# Patient Record
Sex: Male | Born: 1955 | Race: Black or African American | Hispanic: No | Marital: Married | Smoking: Current some day smoker
Health system: Southern US, Community
[De-identification: ages and names within clinical notes are randomized; demographics above are authoritative.]

## PROBLEM LIST (undated history)

## (undated) ENCOUNTER — Emergency Department (HOSPITAL_COMMUNITY): Disposition: A | Discharge status: Home / Self Care | Payer: Self-pay

## (undated) DIAGNOSIS — I639 Cerebral infarction, unspecified: Secondary | ICD-10-CM

## (undated) DIAGNOSIS — E785 Hyperlipidemia, unspecified: Secondary | ICD-10-CM

## (undated) HISTORY — DX: Cerebral infarction, unspecified: I63.9

## (undated) HISTORY — PX: NO PAST SURGERIES: SHX2092

## (undated) HISTORY — DX: Hyperlipidemia, unspecified: E78.5

---

## 2007-01-02 ENCOUNTER — Emergency Department (HOSPITAL_COMMUNITY): Admission: EM | Admit: 2007-01-02 | Discharge: 2007-01-02 | Payer: Self-pay | Admitting: Emergency Medicine

## 2007-01-24 ENCOUNTER — Ambulatory Visit: Payer: Self-pay | Admitting: Internal Medicine

## 2007-02-14 ENCOUNTER — Ambulatory Visit: Payer: Self-pay | Admitting: Internal Medicine

## 2007-02-26 ENCOUNTER — Ambulatory Visit (HOSPITAL_COMMUNITY): Admission: RE | Admit: 2007-02-26 | Discharge: 2007-02-26 | Payer: Self-pay | Admitting: Internal Medicine

## 2007-03-10 ENCOUNTER — Ambulatory Visit: Payer: Self-pay | Admitting: Internal Medicine

## 2007-09-26 ENCOUNTER — Ambulatory Visit: Payer: Self-pay | Admitting: Internal Medicine

## 2007-09-26 LAB — CONVERTED CEMR LAB
Eosinophils Relative: 1 % (ref 0–5)
HCT: 35.9 % — ABNORMAL LOW (ref 39.0–52.0)
Hemoglobin: 11.8 g/dL — ABNORMAL LOW (ref 13.0–17.0)
Lymphocytes Relative: 34 % (ref 12–46)
Lymphs Abs: 2.9 10*3/uL (ref 0.7–4.0)
Monocytes Absolute: 0.7 10*3/uL (ref 0.1–1.0)
Neutro Abs: 5.1 10*3/uL (ref 1.7–7.7)
Platelets: 227 10*3/uL (ref 150–400)
Retic Ct Pct: 2.4 % (ref 0.4–3.1)
WBC: 8.8 10*3/uL (ref 4.0–10.5)

## 2007-10-02 ENCOUNTER — Ambulatory Visit: Payer: Self-pay | Admitting: Vascular Surgery

## 2007-10-02 ENCOUNTER — Ambulatory Visit (HOSPITAL_COMMUNITY): Admission: RE | Admit: 2007-10-02 | Discharge: 2007-10-02 | Payer: Self-pay | Admitting: Internal Medicine

## 2007-11-03 ENCOUNTER — Ambulatory Visit: Payer: Self-pay | Admitting: *Deleted

## 2007-11-03 ENCOUNTER — Ambulatory Visit: Payer: Self-pay | Admitting: Internal Medicine

## 2007-11-03 LAB — CONVERTED CEMR LAB
Ferritin: 26 ng/mL (ref 22–322)
HDL: 42 mg/dL (ref >39)
Saturation Ratios: 17 % — ABNORMAL LOW (ref 20–55)
TIBC: 407 ug/dL (ref 215–435)
UIBC: 336 ug/dL
Uric Acid, Serum: 5.1 mg/dL (ref 4.0–7.8)

## 2007-12-01 ENCOUNTER — Ambulatory Visit: Payer: Self-pay | Admitting: Family Medicine

## 2008-01-08 ENCOUNTER — Ambulatory Visit: Payer: Self-pay | Admitting: Family Medicine

## 2008-01-08 ENCOUNTER — Emergency Department (HOSPITAL_COMMUNITY): Admission: EM | Admit: 2008-01-08 | Discharge: 2008-01-08 | Payer: Self-pay | Admitting: Emergency Medicine

## 2008-01-09 ENCOUNTER — Encounter (INDEPENDENT_AMBULATORY_CARE_PROVIDER_SITE_OTHER): Payer: Self-pay | Admitting: Internal Medicine

## 2008-03-15 ENCOUNTER — Ambulatory Visit: Payer: Self-pay | Admitting: Family Medicine

## 2008-03-15 LAB — CONVERTED CEMR LAB
ALT: 8 units/L (ref 0–53)
AST: 19 units/L (ref 0–37)
Albumin: 4.4 g/dL (ref 3.5–5.2)
Alkaline Phosphatase: 72 units/L (ref 39–117)
BUN: 11 mg/dL (ref 6–23)
Calcium: 9.5 mg/dL (ref 8.4–10.5)
Chloride: 104 meq/L (ref 96–112)
LDL Cholesterol: 124 mg/dL — ABNORMAL HIGH (ref 0–99)
Potassium: 4.6 meq/L (ref 3.5–5.3)
Sodium: 138 meq/L (ref 135–145)
Total Protein: 8.1 g/dL (ref 6.0–8.3)

## 2008-03-22 ENCOUNTER — Ambulatory Visit: Payer: Self-pay | Admitting: Family Medicine

## 2008-03-22 LAB — CONVERTED CEMR LAB
Eosinophils Absolute: 0.1 10*3/uL (ref 0.0–0.7)
Eosinophils Relative: 1 % (ref 0–5)
Ferritin: 31 ng/mL (ref 22–322)
HCT: 35.3 % — ABNORMAL LOW (ref 39.0–52.0)
Hemoglobin: 11.9 g/dL — ABNORMAL LOW (ref 13.0–17.0)
Hgb F Quant: 0 % (ref 0.0–2.0)
Hgb S Quant: 0 % (ref 0.0–0.0)
Lymphocytes Relative: 39 % (ref 12–46)
Lymphs Abs: 3.2 10*3/uL (ref 0.7–4.0)
MCV: 89.4 fL (ref 78.0–100.0)
Monocytes Relative: 7 % (ref 3–12)
RBC: 3.95 M/uL — ABNORMAL LOW (ref 4.22–5.81)
WBC: 8.3 10*3/uL (ref 4.0–10.5)

## 2008-04-05 ENCOUNTER — Encounter: Admission: RE | Admit: 2008-04-05 | Discharge: 2008-07-04 | Payer: Self-pay | Admitting: Family Medicine

## 2011-06-12 LAB — RAPID STREP SCREEN (MED CTR MEBANE ONLY): Streptococcus, Group A Screen (Direct): NEGATIVE

## 2011-08-01 ENCOUNTER — Encounter: Payer: Self-pay | Admitting: *Deleted

## 2011-08-01 ENCOUNTER — Other Ambulatory Visit: Payer: Self-pay

## 2011-08-01 ENCOUNTER — Emergency Department (HOSPITAL_COMMUNITY)
Admission: EM | Admit: 2011-08-01 | Discharge: 2011-08-01 | Discharge status: Against Medical Advice | Payer: Self-pay | Attending: Emergency Medicine | Admitting: Emergency Medicine

## 2011-08-01 DIAGNOSIS — R079 Chest pain, unspecified: Secondary | ICD-10-CM | POA: Insufficient documentation

## 2011-08-01 NOTE — ED Notes (Signed)
Reports approx 1 hour ago, onset of chest discomfort, feels like "he needs to belch" and feels like he cant swallow. Airway intact, resp e/u.

## 2014-03-29 ENCOUNTER — Emergency Department (HOSPITAL_COMMUNITY): Payer: Self-pay

## 2014-03-29 ENCOUNTER — Encounter (HOSPITAL_COMMUNITY): Payer: Self-pay | Admitting: Emergency Medicine

## 2014-03-29 ENCOUNTER — Emergency Department (HOSPITAL_COMMUNITY)
Admission: EM | Admit: 2014-03-29 | Discharge: 2014-03-29 | Disposition: A | Discharge status: Home / Self Care | Payer: Self-pay | Attending: Emergency Medicine | Admitting: Emergency Medicine

## 2014-03-29 DIAGNOSIS — F17200 Nicotine dependence, unspecified, uncomplicated: Secondary | ICD-10-CM | POA: Insufficient documentation

## 2014-03-29 DIAGNOSIS — B070 Plantar wart: Secondary | ICD-10-CM | POA: Insufficient documentation

## 2014-03-29 NOTE — ED Notes (Signed)
Pt presents with lesion to bottom left foot; painful to ambulate; hard and crusty to touch; no warmth; no drainage; denies known foreign body

## 2014-03-29 NOTE — ED Provider Notes (Signed)
CSN: [id number]     Arrival date & time 03/29/14  4098 History   First MD Initiated Contact with Patient 03/29/14 [phone]     Chief Complaint  Patient presents with  . Foot Pain     (Consider location/radiation/quality/duration/timing/severity/associated sxs/prior Treatment) HPI Comments: Patient presents with a chief complaint of left foot pain for the past 2 months.  He reports that he has a "sore" on the bottom of his foot and that the pain is located to the area.  Pain is worse with palpation and bearing weight.  He reports that the "sore" has been there for the past 2 months.  He denies any injury or trauma.  He has not tried any treatment prior to arrival.  He denies fever, chills, swelling of the foot, numbness, or tingling.    The history is provided by the patient.    History reviewed. No pertinent past medical history. History reviewed. No pertinent past surgical history. History reviewed. No pertinent family history. History  Substance Use Topics  . Smoking status: Current Every Day Smoker -- 1.00 packs/day    Types: Cigarettes  . Smokeless tobacco: Not on file  . Alcohol Use: Yes     Comment: occ    Review of Systems  Constitutional: Negative for fever and chills.  Musculoskeletal:       Left foot pain  Neurological: Negative for numbness.      Allergies  Review of patient's allergies indicates no known allergies.  Home Medications   Prior to Admission medications   Not on File   BP 168/94  Pulse 71  Temp(Src) 97.1 F (36.2 C) (Oral)  Resp 18  SpO2 100% Physical Exam  Nursing note and vitals reviewed. Constitutional: He appears well-developed and well-nourished.  HENT:  Head: Normocephalic and atraumatic.  Mouth/Throat: Oropharynx is clear and moist.  Neck: Normal range of motion. Neck supple.  Cardiovascular: Normal rate, regular rhythm and normal heart sounds.   Pulses:      Dorsalis pedis pulses are 2+ on the right side, and 2+ on the left side.   Pulmonary/Chest: Effort normal and breath sounds normal.  Musculoskeletal: Normal range of motion.  Plantar wart of the sole of the left foot near the calcaneous.  No surrounding erythema, edema, or warmth.  Neurological: He is alert.  Distal sensation of left foot intact  Skin: Skin is warm and dry.    ED Course  Procedures (including critical care time) Labs Review Labs Reviewed - No data to display  Imaging Review Dg Foot Complete Left  03/29/2014   CLINICAL DATA:  No known injury. Sore plantar surface of foot at level of the medial aspect of the mid os calcis.  EXAM: LEFT FOOT - COMPLETE 3+ VIEW  COMPARISON:  02/26/2007 MR.  FINDINGS: Soft tissue abnormality plantar aspect of the left foot at the level of the calcaneus may represent the patient's sore.  No underlying plain film findings of osteomyelitis.  No fracture or dislocation.  IMPRESSION: Soft tissue abnormality plantar aspect of the left foot at the level of the calcaneus may represent the patient's sore.  No underlying plain film findings of osteomyelitis.   Electronically Signed   By: Bridgett Larsson M.D.   On: 03/29/2014 09:46     EKG Interpretation None      MDM   Final diagnoses:  None   Patient presenting with a lesion to the plantar aspect of the foot.  Appearance consistent with a Plantar Wart.  No  signs of infection.  Xray ordered in triage did not show any evidence of Osteomyelitis.  Patient given referral to PCP.  Patient is stable for discharge.  Return precautions given.    Santiago GladHeather Merci Walthers, PA-C 03/29/14 1134

## 2014-03-29 NOTE — Discharge Instructions (Signed)
Plantar Wart Warts are benign (noncancerous) growths of the outer skin layer. They can occur at any time in life but are most common during childhood and the teen years. Warts can occur on many skin surfaces of the body. When they occur on the underside (sole) of your foot they are called plantar warts. They often emerge in groups with several small warts encircling a larger growth. CAUSES  Human papillomavirus (HPV) is the cause of plantar warts. HPV attacks a break in the skin of the foot. Walking barefoot can lead to exposure to the wart virus. Plantar warts tend to develop over areas of pressure such as the heel and ball of the foot. Plantar warts often grow into the deeper layers of skin. They may spread to other areas of the sole but cannot spread to other areas of the body. SYMPTOMS  You may also notice a growth on the undersurface of your foot. The wart may grow directly into the sole of the foot, or rise above the surface of the skin on the sole of the foot, or both. They are most often flat from pressure. Warts generally do not cause itching but may cause pain in the area of the wart when you put weight on your foot. DIAGNOSIS  Diagnosis is made by physical examination. This means your caregiver discovers it while examining your foot.  TREATMENT  There are many ways to treat plantar warts. However, warts are very tough. Sometimes it is difficult to treat them so that they go away completely and do not grow back. Any treatment must be done regularly to work. If left untreated, most plantar warts will eventually disappear over a period of one to two years. Treatments you can do at home include:  Putting duct tape over the top of the wart (occlusion), has been found to be effective over several months. The duct tape should be removed each night and reapplied until the wart has disappeared.  Placing over-the-counter medications on top of the wart to help kill the wart virus and remove the wart  tissue (salicylic acid, cantharidin, and dichloroacetic acid ) are useful. These are called keratolytic agents. These medications make the skin soft and gradually layers will shed away. Theses compounds are usually placed on the wart each night and then covered with a band-aid. They are also available in pre-medicated band-aid form. Avoid surrounding skin when applying these liquids as these medications can burn healthy skin. The treatment may take several months of nightly use to be effective.  Cryotherapy to freeze the wart has recently become available over-the-counter for children 4 years and older. This system makes use of a soft narrow applicator connected to a bottle of compressed cold liquid that is applied directly to the wart. This medication can burn health skin and should be used with caution.  As with all over-the-counter medications, read the directions carefully before use. Treatments generally done in your caregiver's office include:  Some aggressive treatments may cause discomfort, discoloration and scaring of the surrounding skin. The risks and benefits of treatment should be discussed with your caregiver.  Freezing the wart with liquid nitrogen (cryotherapy, see above).  Burning the wart with use of very high heat (cautery).  Injecting medication into the wart.  Surgically removing or laser treatment of the wart.  Your caregiver may refer you to a dermatologist for difficult to treat, large sized or large numbers of warts. HOME CARE INSTRUCTIONS   Soak the affected area in warm water. Dry  the area completely when you are done. Remove the top layer of softened skin, then apply the chosen topical medication and reapply a bandage.  Remove the bandage daily and file excess wart tissue (pumice stone works well for this purpose). Repeat the entire process daily or every other day for weeks until the plantar wart disappears.  Several brands of salicylic acid pads are available as  over-the-counter remedies.  Pain can be relieved by wearing a doughnut bandage. This is a bandage with a hole in it. The bandage is put on with the hole over the wart. This helps take the pressure off the wart and gives pain relief. To help prevent plantar warts:  Wear shoes and socks and change them daily.  Keep feet clean and dry.  Check your feet and your children's feet regularly.  Avoid direct contact with warts on other people.  Have growths, or changes on your skin checked by your caregiver. Document Released: 11/24/2003 Document Revised: 11/26/2011 Document Reviewed: 05/04/2009 [facility] Patient Information 2015 [place] [employer]. This information is not intended to replace advice given to you by your health care provider. Make sure you discuss any questions you have with your health care provider.   Emergency Department Resource Guide 1) Find a Doctor and Pay Out of Pocket Although you won't have to find out who is covered by your insurance plan, it is a good idea to ask around and get recommendations. You will then need to call the office and see if the doctor you have chosen will accept you as a new patient and what types of options they offer for patients who are self-pay. Some doctors offer discounts or will set up payment plans for their patients who do not have insurance, but you will need to ask so you aren't surprised when you get to your appointment.  2) Contact Your Local Health Department Not all health departments have doctors that can see patients for sick visits, but many do, so it is worth a call to see if yours does. If you don't know where your local health department is, you can check in your phone book. The CDC also has a tool to help you locate your state's health department, and many state websites also have listings of all of their local health departments.  3) Find a Walk-in Clinic If your illness is not likely to be very severe or complicated, you may want to  try a walk in clinic. These are popping up all over the country in pharmacies, drugstores, and shopping centers. They're usually staffed by nurse practitioners or physician assistants that have been trained to treat common illnesses and complaints. They're usually fairly quick and inexpensive. However, if you have serious medical issues or chronic medical problems, these are probably not your best option.  No Primary Care Doctor: - Call Health Connect at  [phone] - they can help you locate a primary care doctor that  accepts your insurance, provides certain services, etc. - Physician Referral Service- [phone]  Chronic Pain Problems: Organization         Address  Phone   Notes  [facility]  [phone] Patients need to be referred by their primary care doctor.   Medication Assistance: Organization         Address  Phone   Notes  [facility] Medication [facility] [address] [place], [place] [phone] --Must be a resident of [facility] [place] -- Must have NO  insurance coverage whatsoever (no Medicaid/ Medicare, etc.) -- The pt. MUST have a primary care doctor that directs their care regularly and follows them in the community   MedAssist  [phone]   [employer]  [phone]    Agencies that provide inexpensive medical care: Organization         Address  Phone   Notes  [facility]  [phone]   [facility]    [phone]   [facility] [address] [place], [place] [address] [phone]   [facility] [address] [place] [place] [phone]   Planned Parenthood    [phone]   [facility]    [phone]   [facility]  [address], [place] Phone:  [phone], Fax:  [phone] Hours of Operation:  9 am - 6 pm, M-F.  Also accepts Medicaid/Medicare and self-pay.  [facility]  [address], [place] Phone: [phone], Fax: [phone]. Hours of Operation:  8:30 am - 5:30 pm, M-F.  Also accepts Medicaid and self-pay.  [facility] [place] [address], [place] Phone: [phone]   [facility] [address] [place], [place] [phone], Ext. [phone] Mondays & Thursdays: 7-9 AM.  First 15 patients are seen on a first come, first serve basis.    Medicaid-accepting [facility] Providers:  Organization         Address  Phone   Notes  [facility] [address],  [phone] Also accepts self-pay patients.  [facility] [address] Laurell Josephs [place], [place]  [phone]   [facility] [address], [place] [phone]   [facility] [address], [place] [phone]   Renaye Rakers [address], [place]   [phone] Only accepts [place] Access [place] patients after they have their name applied to their card.   Self-Pay (no insurance) in [facility]:  Organization         Address  Phone   Notes  Sickle Cell Patients, [facility] [address] [place], [place] [phone]   [facility] [address] [place], [place] [phone]   [facility] [place]  [address], [place] [phone]   [facility]/Dr. Osei-Bonsu  [address] [place] or [address], [place] [phone] Phone number for both [place] and [place] locations is the same.  [facility] and [facility] [place] [address], [place] [phone]   [facility] [address] [place] or [address] [phone]   [facility] [address] [place] [phone], phone; [phone], fax  Sees patients 1st and 3rd Saturday of every month.  Must not qualify for public or private insurance (i.e. Medicaid, Medicare,  Health Choice, Veterans' Benefits)  Household income should be no more than 200% of the poverty level The clinic cannot treat you if you are pregnant or think you are pregnant  Sexually transmitted diseases are not treated at the clinic.    Dental Care: Organization         Address  Phone  Notes  [facility]  Department of Public [facility] [address] [place], [place] [phone] Accepts children up to age 40 who are enrolled in [place] or [place] Health Choice; pregnant women with a Medicaid card; and children who have applied for Medicaid or [place] Health Choice, but were declined, whose parents can pay a reduced fee at time of service.  [facility] Department of [facility]  [address], [place] [phone] Accepts children up to age 28 who are enrolled in [place] or [place] Health Choice; pregnant women with a Medicaid card; and children who have applied for Medicaid or  Health Choice, but were declined, whose parents can pay a reduced fee at time of service.  [facility]  [address] [place], [place] [phone] Patients are seen by appointment only. Walk-ins are not accepted. [facility] will see patients 46 years of age and older. Monday - Tuesday (8am-5pm) Most Wednesdays (8:30-5pm) $30 per visit, cash only  [facility] Adult Dental Access PROGRAM  [address], [facility] [phone] Patients are seen by appointment only. Walk-ins are not accepted. [facility] will see patients 61 years of age and older. One Wednesday Evening (Monthly: Volunteer Based).  $30 per visit, cash only  [employer]  [phone] for adults; Children under age 56, call Graduate Pediatric Dentistry at [phone]. Children aged 80-14, please call [phone] to  request a pediatric application.  Dental services are provided in all areas of dental care including fillings, crowns and bridges, complete and partial dentures, implants, gum treatment, root canals, and extractions. Preventive care is also provided. Treatment is provided to both adults and children. Patients are selected via a lottery and there is often a waiting list.   [facility] [address], [place]  [phone] [email or web address]   [facility] [address] [place], [place] [phone], Ext. [phone] Second and Fourth Thursday of each month, opens at 6:30 AM; Clinic ends at 9 AM.  Patients are seen on a first-come first-served basis, and a limited number are seen during each clinic.   [facility]  [address] Ether Griffins [place], [place] [phone]   Eligibility Requirements You must have lived in [place], [place], or [place] counties for at least the last three months.   You cannot be eligible for state or federal sponsored [place], including [employer], [place], or [employer].   You generally cannot be eligible for healthcare insurance through your employer.    How to apply: Eligibility screenings are held every Tuesday and Wednesday afternoon from 1:00 pm until 4:00 pm. You do not need an appointment for the interview!  [facility] [address] [place], [place] [phone]   [facility] Health Department  [phone]   [facility] Health Department  [phone]   [facility] Health Department  [phone]    Behavioral Health Resources in the Community: Intensive Outpatient Programs Organization         Address  Phone  Notes  [facility] [address], [place], [place] [phone]   [facility] Outpatient [address] [place], [place] [phone]   [facility] [address] [place], [place]  [phone]   [facility] Mental Health [address],  [place], [place] [phone] or [phone]   Substance Abuse Resources Organization  Address  Phone  Notes  Al[facility] Drug Services  [phone]   [facility]  [phone]   [facility] [place]  [phone]   Floydene Flock  [phone]   Residential & Outpatient Substance Abuse Program  [phone]   Psychological Services Organization         Address  Phone  Notes  [facility] Behavioral Health  [phone]   [facility] Services  [phone]   [facility] [address], [place] [phone] or [phone]    Mobile Crisis Teams Organization         Address  Phone  Notes  [facility], Mobile Crisis Care Unit  [phone]   [facility]  [address] [place], [place] [phone]   Doristine Locks [address] [place] [phone]    Self-Help/Support Groups Organization         Address  Phone             Notes  [facility] [place] - variety of support groups  336- I7437963 Call for more information  Narcotics Anonymous (NA), [facility] [address], [employer] [place]  2 meetings at this location   Statistician         Address  Phone  Notes  [facility] [address],    [place]  [phone]   [facility]  [address] [place] [address], [place], [place] [phone]   [facility] [address] [place], [place] [phone] Admissions: 8am-3pm M-F  [facility] [address]    [place], [place] [phone]   [facility] [address] [place], [place], [place] [phone]   [facility] [address],  [place], [place] [phone]   [facility] - Intensive Outpatient [address] Laurell Joseph[address], [place], [place] [phone]   [facility] ([facility]iction [facility] [address] [place]  [place], [place] [phone] or [phone])[phone]   [facility] ([facility]) [address] [place], [place] [phone] Accepts Medicaid  [facility] [address],  [place] [phone] Substance Abuse/Addiction Treatment   [facility] Organization         Address  Phone  Notes  [facility]  [phone]   Angie Fava, PhD [address] Ervin Knack [place], [place]   [phone] or [phone]   [facility] Behavioral   [address] [place], [place] [phone]   [facility] [address], [place], [place] [phone] Insurance/Medicaid/sponsorship through [facility] [address]                                    [place], [place] [phone] Therapy/tele-psych/case  [facility] [address] Street[place], [place] [phone]    Dr. Lolly Mustache  [phone]   [facility]  [employer] [facility] Dept. 1) [address], [place] 2) [address] [place] 3)  [address], [place] [phone]  [phone]   [facility] Child Abuse Hotline (720)361-0916 or [phone])547-2159 (After Hours)

## 2014-03-29 NOTE — ED Notes (Signed)
Pt states pain to bottom of left foot x 2 months.  Pt has not been seen for treatment prior.  Pt states knot on bottom of left foot.  Pain is worse with standing.

## 2014-03-30 NOTE — ED Provider Notes (Signed)
Medical screening examination/treatment/procedure(s) were performed by non-physician practitioner and as supervising physician I was immediately available for consultation/collaboration.   EKG Interpretation None        Audree CamelScott T Jaythen Hamme, MD 03/30/14 [phone]

## 2014-10-04 ENCOUNTER — Ambulatory Visit (INDEPENDENT_AMBULATORY_CARE_PROVIDER_SITE_OTHER): Payer: Self-pay | Admitting: Internal Medicine

## 2014-10-04 VITALS — BP 132/88 | HR 78 | Temp 98.0°F | Resp 18 | Ht 72.75 in | Wt 170.8 lb

## 2014-10-04 DIAGNOSIS — N509 Disorder of male genital organs, unspecified: Secondary | ICD-10-CM

## 2014-10-04 DIAGNOSIS — Z202 Contact with and (suspected) exposure to infections with a predominantly sexual mode of transmission: Secondary | ICD-10-CM

## 2014-10-04 DIAGNOSIS — R21 Rash and other nonspecific skin eruption: Secondary | ICD-10-CM

## 2014-10-04 DIAGNOSIS — N5089 Other specified disorders of the male genital organs: Secondary | ICD-10-CM

## 2014-10-04 LAB — POCT URINALYSIS DIPSTICK
Bilirubin, UA: NEGATIVE
Glucose, UA: NEGATIVE
Ketones, UA: NEGATIVE
Leukocytes, UA: NEGATIVE
Nitrite, UA: NEGATIVE
Protein, UA: NEGATIVE
Spec Grav, UA: 1.025
Urobilinogen, UA: 0.2
pH, UA: 6

## 2014-10-04 LAB — POCT CBC
Granulocyte percent: 69.4 %G (ref 37–80)
HCT, POC: 42.7 % — AB (ref 43.5–53.7)
HEMOGLOBIN: 13.7 g/dL — AB (ref 14.1–18.1)
LYMPH, POC: 2.7 (ref 0.6–3.4)
MCH, POC: 30.1 pg (ref 27–31.2)
MCHC: 32.1 g/dL (ref 31.8–35.4)
MCV: 93.6 fL (ref 80–97)
MID (cbc): 0.2 (ref 0–0.9)
MPV: 6.7 fL (ref 0–99.8)
PLATELET COUNT, POC: 289 10*3/uL (ref 142–424)
POC GRANULOCYTE: 6.6 (ref 2–6.9)
POC LYMPH %: 28.2 % (ref 10–50)
POC MID %: 2.4 %M (ref 0–12)
RBC: 4.56 M/uL — AB (ref 4.69–6.13)
RDW, POC: 14.9 %
WBC: 9.5 10*3/uL (ref 4.6–10.2)

## 2014-10-04 LAB — POCT UA - MICROSCOPIC ONLY
BACTERIA, U MICROSCOPIC: NEGATIVE
CASTS, UR, LPF, POC: NEGATIVE
CRYSTALS, UR, HPF, POC: NEGATIVE
EPITHELIAL CELLS, URINE PER MICROSCOPY: NEGATIVE
MUCUS UA: NEGATIVE
Yeast, UA: NEGATIVE

## 2014-10-04 LAB — HIV ANTIBODY (ROUTINE TESTING W REFLEX): HIV: NONREACTIVE

## 2014-10-04 LAB — RPR

## 2014-10-04 MED ORDER — DOXYCYCLINE HYCLATE 100 MG PO TABS: 100.0000 mg | ORAL_TABLET | Freq: Two times a day (BID) | ORAL | Status: DC

## 2014-10-04 MED ORDER — VALACYCLOVIR HCL 1 G PO TABS: 1000.0000 mg | ORAL_TABLET | Freq: Two times a day (BID) | ORAL | Status: DC

## 2014-10-04 MED ORDER — MUPIROCIN 2 % EX OINT: 1.0000 "application " | TOPICAL_OINTMENT | Freq: Three times a day (TID) | CUTANEOUS | Status: DC

## 2014-10-04 NOTE — Progress Notes (Signed)
   Subjective:    Patient ID: Justin Odom, male    DOB: 11/11/1955, 59 y.o.   MRN: [id number]  HPI Had exposure to risky male contact, was told she had trichomonas, he has rash on his penis distal foreskin for 2-3 weeks. Not painful to urinate, no discharge. Has no doctor. No fatigue, anorexia, weight loss. 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   Review of Systems     Objective:   Physical Exam  Constitutional: He is oriented to person, place, and time. He appears well-developed and well-nourished. No distress.  HENT:  Head: Normocephalic.  Nose: Nose normal.  Eyes: Conjunctivae and EOM are normal. Pupils are equal, round, and reactive to light. No scleral icterus.  Neck: Normal range of motion.  Pulmonary/Chest: Effort normal.  Abdominal: Hernia confirmed negative in the right inguinal area and confirmed negative in the left inguinal area.  Genitourinary: Testes normal.    Uncircumcised. Penile tenderness present. No discharge found.  Musculoskeletal: Normal range of motion.  Lymphadenopathy:       Right: No inguinal adenopathy present.       Left: No inguinal adenopathy present.  Neurological: He is alert and oriented to person, place, and time. He exhibits normal muscle tone. Coordination normal.  Skin: Rash noted.  Psychiatric: He has a normal mood and affect. His behavior is normal. Thought content normal.   Ccua/uriprobe Viral culture/HIV/RPR Results for orders placed or performed in visit on 10/04/14  POCT CBC  Result Value Ref Range   WBC 9.5 4.6 - 10.2 K/uL   Lymph, poc 2.7 0.6 - 3.4   POC LYMPH PERCENT 28.2 10 - 50 %L   MID (cbc) 0.2 0 - 0.9   POC MID % 2.4 0 - 12 %M   POC Granulocyte 6.6 2 - 6.9   Granulocyte percent 69.4 37 - 80 %G   RBC 4.56 (A) 4.69 - 6.13 M/uL   Hemoglobin 13.7 (A) 14.1 - 18.1 g/dL   HCT, POC 40.942.7 (A) 81.143.5 - 53.7 %   MCV 93.6 80 - 97 fL   MCH, POC 30.1 27 - 31.2 pg   MCHC 32.1 31.8 - 35.4 g/dL   RDW, POC 91.414.9 %   Platelet  Count, POC 289 142 - 424 K/uL   MPV 6.7 0 - 99.8 fL  POCT urinalysis dipstick  Result Value Ref Range   Color, UA yellow    Clarity, UA clear    Glucose, UA neg    Bilirubin, UA neg    Ketones, UA neg    Spec Grav, UA 1.025    Blood, UA trace-intact    pH, UA 6.0    Protein, UA neg    Urobilinogen, UA 0.2    Nitrite, UA neg    Leukocytes, UA Negative   POCT UA - Microscopic Only  Result Value Ref Range   WBC, Ur, HPF, POC 1-2    RBC, urine, microscopic 1-3    Bacteria, U Microscopic neg    Mucus, UA neg    Epithelial cells, urine per micros neg    Crystals, Ur, HPF, POC neg    Casts, Ur, LPF, POC neg    Yeast, UA neg    Ulcers are almost healed      Assessment & Plan:  Genital rash/ulcers Valtrex 1g bid 10d/Doxycycline 100mg  bid 10d

## 2014-10-04 NOTE — Patient Instructions (Signed)
Herpes Simplex Herpes simplex is generally classified as Type 1 or Type 2. Type 1 is generally the type that is responsible for cold sores. Type 2 is generally associated with sexually transmitted diseases. We now know that most of the thoughts on these viruses are inaccurate. We find that HSV1 is also present genitally and HSV2 can be present orally, but this will vary in different locations of the world. Herpes simplex is usually detected by doing a culture. Blood tests are also available for this virus; however, the accuracy is often not as good.  PREPARATION FOR TEST No preparation or fasting is necessary. NORMAL FINDINGS  No virus present  No HSV antigens or antibodies present Ranges for normal findings may vary among different laboratories and hospitals. You should always check with your doctor after having lab work or other tests done to discuss the meaning of your test results and whether your values are considered within normal limits. MEANING OF TEST  Your caregiver will go over the test results with you and discuss the importance and meaning of your results, as well as treatment options and the need for additional tests if necessary. OBTAINING THE TEST RESULTS  It is your responsibility to obtain your test results. Ask the lab or department performing the test when and how you will get your results. Document Released: 10/06/2004 Document Revised: 11/26/2011 Document Reviewed: 08/14/2008 [facility] Patient Information 2015 [place], [place]. This information is not intended to replace advice given to you by your health care provider. Make sure you discuss any questions you have with your health care provider. Sexually Transmitted Disease A sexually transmitted disease (STD) is a disease or infection that may be passed (transmitted) from person to person, usually during sexual activity. This may happen by way of saliva, semen, blood, vaginal mucus, or urine. Common STDs include:   Gonorrhea.    Chlamydia.   Syphilis.   HIV and AIDS.   Genital herpes.   Hepatitis B and C.   Trichomonas.   Human papillomavirus (HPV).   Pubic lice.   Scabies.  Mites.  Bacterial vaginosis. WHAT ARE CAUSES OF STDs? An STD may be caused by bacteria, a virus, or parasites. STDs are often transmitted during sexual activity if one person is infected. However, they may also be transmitted through nonsexual means. STDs may be transmitted after:   Sexual intercourse with an infected person.   Sharing sex toys with an infected person.   Sharing needles with an infected person or using unclean piercing or tattoo needles.  Having intimate contact with the genitals, mouth, or rectal areas of an infected person.   Exposure to infected fluids during birth. WHAT ARE THE SIGNS AND SYMPTOMS OF STDs? Different STDs have different symptoms. Some people may not have any symptoms. If symptoms are present, they may include:   Painful or bloody urination.   Pain in the pelvis, abdomen, vagina, anus, throat, or eyes.   A skin rash, itching, or irritation.  Growths, ulcerations, blisters, or sores in the genital and anal areas.  Abnormal vaginal discharge with or without bad odor.   Penile discharge in men.   Fever.   Pain or bleeding during sexual intercourse.   Swollen glands in the groin area.   Yellow skin and eyes (jaundice). This is seen with hepatitis.   Swollen testicles.  Infertility.  Sores and blisters in the mouth. HOW ARE STDs DIAGNOSED? To make a diagnosis, your health care provider may:   Take a medical history.  Perform a physical exam.   Take a sample of any discharge to examine.  Swab the throat, cervix, opening to the penis, rectum, or vagina for testing.  Test a sample of your first morning urine.   Perform blood tests.   Perform a Pap test, if this applies.   Perform a colposcopy.   Perform a laparoscopy.  HOW ARE STDs  TREATED? Treatment depends on the STD. Some STDs may be treated but not cured.   Chlamydia, gonorrhea, trichomonas, and syphilis can be cured with antibiotic medicine.   Genital herpes, hepatitis, and HIV can be treated, but not cured, with prescribed medicines. The medicines lessen symptoms.   Genital warts from HPV can be treated with medicine or by freezing, burning (electrocautery), or surgery. Warts may come back.   HPV cannot be cured with medicine or surgery. However, abnormal areas may be removed from the cervix, vagina, or vulva.   If your diagnosis is confirmed, your recent sexual partners need treatment. This is true even if they are symptom-free or have a negative culture or evaluation. They should not have sex until their health care providers say it is okay. HOW CAN I REDUCE MY RISK OF GETTING AN STD? Take these steps to reduce your risk of getting an STD:  Use latex condoms, dental dams, and water-soluble lubricants during sexual activity. Do not use petroleum jelly or oils.  Avoid having multiple sex partners.  Do not have sex with someone who has other sex partners.  Do not have sex with anyone you do not know or who is at high risk for an STD.  Avoid risky sex practices that can break your skin.  Do not have sex if you have open sores on your mouth or skin.  Avoid drinking too much alcohol or taking illegal drugs. Alcohol and drugs can affect your judgment and put you in a vulnerable position.  Avoid engaging in oral and anal sex acts.  Get vaccinated for HPV and hepatitis. If you have not received these vaccines in the past, talk to your health care provider about whether one or both might be right for you.   If you are at risk of being infected with HIV, it is recommended that you take a prescription medicine daily to prevent HIV infection. This is called pre-exposure prophylaxis (PrEP). You are considered at risk if:  You are a man who has sex with other  men (MSM).  You are a heterosexual man or woman and are sexually active with more than one partner.  You take drugs by injection.  You are sexually active with a partner who has HIV.  Talk with your health care provider about whether you are at high risk of being infected with HIV. If you choose to begin PrEP, you should first be tested for HIV. You should then be tested every 3 months for as long as you are taking PrEP.  WHAT SHOULD I DO IF I THINK I HAVE AN STD?  See your health care provider.   Tell your sexual partner(s). They should be tested and treated for any STDs.  Do not have sex until your health care provider says it is okay. WHEN SHOULD I GET IMMEDIATE MEDICAL CARE? Contact your health care provider right away if:   You have severe abdominal pain.  You are a man and notice swelling or pain in your testicles.  You are a woman and notice swelling or pain in your vagina. Document Released: 11/24/2002 Document  Revised: 09/08/2013 Document Reviewed: 03/24/2013 [facility] Patient Information 2015 [place], [place]. This information is not intended to replace advice given to you by your health care provider. Make sure you discuss any questions you have with your health care provider.

## 2014-10-05 LAB — GC/CHLAMYDIA PROBE AMP
CT PROBE, AMP APTIMA: NEGATIVE
GC Probe RNA: NEGATIVE

## 2014-10-06 LAB — HERPES SIMPLEX VIRUS CULTURE: ORGANISM ID, BACTERIA: DETECTED

## 2014-11-25 DIAGNOSIS — I639 Cerebral infarction, unspecified: Secondary | ICD-10-CM

## 2014-11-25 HISTORY — DX: Cerebral infarction, unspecified: I63.9

## 2014-11-29 ENCOUNTER — Ambulatory Visit: Payer: Self-pay | Attending: Internal Medicine | Admitting: Internal Medicine

## 2014-11-29 VITALS — BP 129/82 | HR 63 | Temp 97.8°F | Resp 16 | Ht 71.0 in | Wt 162.0 lb

## 2014-11-29 DIAGNOSIS — I639 Cerebral infarction, unspecified: Secondary | ICD-10-CM | POA: Insufficient documentation

## 2014-11-29 DIAGNOSIS — G8190 Hemiplegia, unspecified affecting unspecified side: Secondary | ICD-10-CM

## 2014-11-29 DIAGNOSIS — Z8673 Personal history of transient ischemic attack (TIA), and cerebral infarction without residual deficits: Secondary | ICD-10-CM | POA: Insufficient documentation

## 2014-11-29 DIAGNOSIS — G8194 Hemiplegia, unspecified affecting left nondominant side: Secondary | ICD-10-CM | POA: Insufficient documentation

## 2014-11-29 HISTORY — DX: Personal history of transient ischemic attack (TIA), and cerebral infarction without residual deficits: Z86.73

## 2014-11-29 LAB — CBC WITH DIFFERENTIAL/PLATELET
Basophils Absolute: 0.1 10*3/uL (ref 0.0–0.1)
Basophils Relative: 1 % (ref 0–1)
Eosinophils Absolute: 0.1 10*3/uL (ref 0.0–0.7)
Eosinophils Relative: 1 % (ref 0–5)
HCT: 42 % (ref 39.0–52.0)
Hemoglobin: 13.8 g/dL (ref 13.0–17.0)
Lymphocytes Relative: 40 % (ref 12–46)
Lymphs Abs: 2.4 10*3/uL (ref 0.7–4.0)
MCH: 30.1 pg (ref 26.0–34.0)
MCHC: 32.9 g/dL (ref 30.0–36.0)
MCV: 91.7 fL (ref 78.0–100.0)
MPV: 9.6 fL (ref 8.6–12.4)
Monocytes Absolute: 0.5 10*3/uL (ref 0.1–1.0)
Monocytes Relative: 8 % (ref 3–12)
Neutro Abs: 3 10*3/uL (ref 1.7–7.7)
Neutrophils Relative %: 50 % (ref 43–77)
Platelets: 286 10*3/uL (ref 150–400)
RBC: 4.58 MIL/uL (ref 4.22–5.81)
RDW: 13.1 % (ref 11.5–15.5)
WBC: 5.9 10*3/uL (ref 4.0–10.5)

## 2014-11-29 LAB — COMPLETE METABOLIC PANEL WITH GFR
ALBUMIN: 4.2 g/dL (ref 3.5–5.2)
ALT: 10 U/L (ref 0–53)
AST: 20 U/L (ref 0–37)
Alkaline Phosphatase: 67 U/L (ref 39–117)
BILIRUBIN TOTAL: 0.5 mg/dL (ref 0.2–1.2)
BUN: 10 mg/dL (ref 6–23)
CALCIUM: 9.8 mg/dL (ref 8.4–10.5)
CO2: 27 mEq/L (ref 19–32)
CREATININE: 0.97 mg/dL (ref 0.50–1.35)
Chloride: 103 mEq/L (ref 96–112)
GFR, EST NON AFRICAN AMERICAN: 86 mL/min
GFR, Est African American: >89 mL/min
Glucose, Bld: 88 mg/dL (ref 70–99)
Potassium: 4.8 mEq/L (ref 3.5–5.3)
SODIUM: 139 meq/L (ref 135–145)
TOTAL PROTEIN: 7.5 g/dL (ref 6.0–8.3)

## 2014-11-29 LAB — LIPID PANEL
Cholesterol: 200 mg/dL (ref 0–200)
HDL: 31 mg/dL — ABNORMAL LOW (ref >=40)
LDL Cholesterol: 151 mg/dL — ABNORMAL HIGH (ref 0–99)
Total CHOL/HDL Ratio: 6.5 Ratio
Triglycerides: 90 mg/dL (ref <150)
VLDL: 18 mg/dL (ref 0–40)

## 2014-11-29 LAB — POCT GLYCOSYLATED HEMOGLOBIN (HGB A1C): HEMOGLOBIN A1C: 5.1

## 2014-11-29 LAB — TSH: TSH: 2.25 u[IU]/mL (ref 0.350–4.500)

## 2014-11-29 MED ORDER — ATORVASTATIN CALCIUM 40 MG PO TABS: 40.0000 mg | ORAL_TABLET | Freq: Every day | ORAL | Status: DC

## 2014-11-29 MED ORDER — ASPIRIN EC 325 MG PO TBEC: 325.0000 mg | DELAYED_RELEASE_TABLET | Freq: Every day | ORAL | Status: DC

## 2014-11-29 NOTE — Progress Notes (Signed)
Pt comes in today to establish medical care s.p Left side weakness,CVA Pt was admitted in outside hospital in [place] while on business trip for job Pt has left side wekness with facial droop, wheelchair assist needed Denies chest pain ,sob  Pt need Speech, PT/OT referral Prescribed Atorvastatin 40 mg tab, Baby ASA 81 mg  VSS States he quit smoking last week Declined Flu/PNA vaccine

## 2014-11-29 NOTE — Progress Notes (Signed)
Patient ID: Justin Odom, male   DOB: November 12, 1955, 59 y.o.   MRN: [id number]   Justin Odom, is a 60 y.o. male  [id number]:[id number]  [id number]:[id number]  DOB - 04-08-1956  CC:  Chief Complaint  Patient presents with  . Hospitalization Follow-up    CVA       HPI: Justin Odom is a 59 y.o. male here today to establish medical care. Patient with no significant past medical history except for STD and nicotine abuse, was [place] about 1 week ago on official assignment from work, developed left-sided weakness and facial droop, MRI confirmed lacunar infarct, patient returned home to [place] and came to our clinic today to establish medical care and for follow-up of cerebrovascular accident. Patient slowly regaining strength on his left but still weak and on wheelchair because of gait abnormality. His speech is clear now, denies headache, no chest pain, still has facial droop but getting better with gum chewing. Patient accompanied by significant other. He has no personal history of hypertension or diabetes prior to this episode, not on any medication, but now on simvastatin and aspirin. Patient has positive attitude, and ready to do all that is required to get better, he quit smoking 1 week ago after the stroke episode.  No Known Allergies Past Medical History  Diagnosis Date  . CVA (cerebral infarction)   . Hyperlipidemia    Current Outpatient Prescriptions on File Prior to Visit  Medication Sig Dispense Refill  . doxycycline (VIBRA-TABS) 100 MG tablet Take 1 tablet (100 mg total) by mouth 2 (two) times daily. (Patient not taking: Reported on 11/29/2014) 20 tablet 0  . mupirocin ointment (BACTROBAN) 2 % Apply 1 application topically 3 (three) times daily. (Patient not taking: Reported on 11/29/2014) 30 g 02  . valACYclovir (VALTREX) 1000 MG tablet Take 1 tablet (1,000 mg total) by mouth 2 (two) times daily. (Patient not taking: Reported on 11/29/2014) 20 tablet 1   No current facility-administered  medications on file prior to visit.   Family History  Problem Relation Age of Onset  . Cancer Mother   . Cancer Sister    History   Social History  . Marital Status: Divorced    Spouse Name: N/A  . Number of Children: N/A  . Years of Education: N/A   Occupational History  . Not on file.   Social History Main Topics  . Smoking status: Current Every Day Smoker -- 1.00 packs/day    Types: Cigarettes  . Smokeless tobacco: Not on file     Comment: states he quit last week   . Alcohol Use: 0.0 oz/week    0 Standard drinks or equivalent per week     Comment: occ  . Drug Use: Yes    Special: Marijuana  . Sexual Activity: Not on file   Other Topics Concern  . Not on file   Social History Narrative    Review of Systems: Constitutional: Negative for fever, chills, diaphoresis, activity change, appetite change and fatigue. HENT: Negative for ear pain, nosebleeds, congestion, facial swelling, rhinorrhea, neck pain, neck stiffness and ear discharge.  Eyes: Negative for pain, discharge, redness, itching and visual disturbance. Respiratory: Negative for cough, choking, chest tightness, shortness of breath, wheezing and stridor.  Cardiovascular: Negative for chest pain, palpitations and leg swelling. Gastrointestinal: Negative for abdominal distention. Genitourinary: Negative for dysuria, urgency, frequency, hematuria, flank pain, decreased urine volume, difficulty urinating and dyspareunia.  Musculoskeletal: Negative for back pain Neurological: Negative for dizziness, tremors, seizures, syncope, ++ facial asymmetry,  no speech difficulty  Hematological: Negative for adenopathy. Does not bruise/bleed easily. Psychiatric/Behavioral: Negative for hallucinations, behavioral problems, confusion, dysphoric mood, decreased concentration and agitation.    Objective:   Filed Vitals:   11/29/14 0924  BP: 129/82  Pulse: 63  Temp: 97.8 F (36.6 C)  Resp: 16    Physical  Exam: Constitutional: Patient appears well-developed and well-nourished. No distress. HENT: Normocephalic, atraumatic, External right and left ear normal. Oropharynx is clear and moist.  Eyes: Conjunctivae and EOM are normal. PERRLA, no scleral icterus. Neck: Normal ROM. Neck supple. No JVD. No tracheal deviation. No thyromegaly. CVS: RRR, S1/S2 +, no murmurs, no gallops, no carotid bruit.  Pulmonary: Effort and breath sounds normal, no stridor, rhonchi, wheezes, rales.  Abdominal: Soft. BS +, no distension, tenderness, rebound or guarding.  Musculoskeletal: Normal range of motion. No edema and no tenderness, muscle power 3/5 on left, 5 out of 5 on the right both upper and lower limbs. Lymphadenopathy: No lymphadenopathy noted, cervical, inguinal or axillary Neuro: Alert. Normal reflexes, muscle tone coordination. Right facial nerve palsy Skin: Skin is warm and dry. No rash noted. Not diaphoretic. No erythema. No pallor. Psychiatric: Normal mood and affect. Behavior, judgment, thought content normal.  Lab Results  Component Value Date   WBC 9.5 10/04/2014   HGB 13.7* 10/04/2014   HCT 42.7* 10/04/2014   MCV 93.6 10/04/2014   PLT See Comment K/uL 03/22/2008   Lab Results  Component Value Date   CREATININE 0.99 03/15/2008   BUN 11 03/15/2008   NA 138 03/15/2008   K 4.6 03/15/2008   CL 104 03/15/2008   CO2 24 03/15/2008    No results found for: HGBA1C Lipid Panel     Component Value Date/Time   CHOL 188 03/15/2008 2126   TRIG 74 03/15/2008 2126   HDL 49 03/15/2008 2126   CHOLHDL 3.8 Ratio 03/15/2008 2126   VLDL 15 03/15/2008 2126   LDLCALC 124* 03/15/2008 2126       Assessment and plan:   1. CVA (cerebral vascular accident)  - aspirin EC 325 MG tablet; Take 1 tablet (325 mg total) by mouth daily.  Dispense: 90 tablet; Refill: 3 - atorvastatin (LIPITOR) 40 MG tablet; Take 1 tablet (40 mg total) by mouth daily.  Dispense: 90 tablet; Refill: 3  2. Acute left  hemiparesis  - Ambulatory referral to Physical Therapy  Patient was counseled extensively about nutrition and exercise. Patient encouraged to continue smoking cessation. Patient will be reevaluated in 4 weeks to determine the possibility of return to work  Return in about 4 weeks (around 12/27/2014) for CVA, Annual Physical.  The patient was given clear instructions to go to ER or return to medical center if symptoms don't improve, worsen or new problems develop. The patient verbalized understanding. The patient was told to call to get lab results if they haven't heard anything in the next week.     This note has been created with Education officer, environmentalDragon speech recognition software and smart phrase technology. Any transcriptional errors are unintentional.    Jeanann LewandowskyJEGEDE, Roxanne Orner, MD, MHA, FACP, FAAP [facility] [place], [place] [phone]   11/29/2014, 9:57 AM

## 2014-11-29 NOTE — Patient Instructions (Addendum)
Ischemic Stroke A stroke (cerebrovascular accident) is the sudden death of brain tissue. It is a medical emergency. A stroke can cause permanent loss of brain function. This can cause problems with different parts of your body. A transient ischemic attack (TIA) is different because it does not cause permanent damage. A TIA is a short-lived problem of poor blood flow affecting a part of the brain. A TIA is also a serious problem because having a TIA greatly increases the chances of having a stroke. When symptoms first develop, you cannot know if the problem might be a stroke or a TIA. CAUSES  A stroke is caused by a decrease of oxygen supply to an area of your brain. It is usually the result of a small blood clot or collection of cholesterol or fat (plaque) that blocks blood flow in the brain. A stroke can also be caused by blocked or damaged carotid arteries.  RISK FACTORS  High blood pressure (hypertension).  High cholesterol.  Diabetes mellitus.  Heart disease.  The buildup of plaque in the blood vessels (peripheral artery disease or atherosclerosis).  The buildup of plaque in the blood vessels providing blood and oxygen to the brain (carotid artery stenosis).  An abnormal heart rhythm (atrial fibrillation).  Obesity.  Smoking.  Taking oral contraceptives (especially in combination with smoking).  Physical inactivity.  A diet high in fats, salt (sodium), and calories.  Alcohol use.  Use of illegal drugs (especially cocaine and methamphetamine).  Being African American.  Being over the age of 39.  Family history of stroke.  Previous history of blood clots, stroke, TIA, or heart attack.  Sickle cell disease. SYMPTOMS  These symptoms usually develop suddenly, or may be newly present upon awakening from sleep:  Sudden weakness or numbness of the face, arm, or leg, especially on one side of the body.  Sudden trouble walking or difficulty moving arms or legs.  Sudden  confusion.  Sudden personality changes.  Trouble speaking (aphasia) or understanding.  Difficulty swallowing.  Sudden trouble seeing in one or both eyes.  Double vision.  Dizziness.  Loss of balance or coordination.  Sudden severe headache with no known cause.  Trouble reading or writing. DIAGNOSIS  Your health care provider can often determine the presence or absence of a stroke based on your symptoms, history, and physical exam. Computed tomography (CT) of the brain is usually performed to confirm the stroke, determine causes, and determine stroke severity. Other tests may be done to find the cause of the stroke. These tests may include:  Electrocardiography.  Continuous heart monitoring.  Echocardiography.  Carotid ultrasonography.  Magnetic resonance imaging (MRI).  A scan of the brain circulation.  Blood tests. PREVENTION  The risk of a stroke can be decreased by appropriately treating high blood pressure, high cholesterol, diabetes, heart disease, and obesity and by quitting smoking, limiting alcohol, and staying physically active. TREATMENT  Time is of the essence. It is important to seek treatment at the first sign of these symptoms because you may receive a medicine to dissolve the clot (thrombolytic) that cannot be given if too much time has passed since your symptoms began. Even if you do not know when your symptoms began, get treatment as soon as possible as there are other treatment options available including oxygen, intravenous (IV) fluids, and medicines to thin the blood (anticoagulants). Treatment of stroke depends on the duration, severity, and cause of your symptoms. Medicines and dietary changes may be used to address diabetes, high blood  pressure, and other risk factors. Physical, speech, and occupational therapists will assess you and work with you to improve any functions impaired by the stroke. Measures will be taken to prevent short-term and long-term  complications, including infection from breathing foreign material into the lungs (aspiration pneumonia), blood clots in the legs, bedsores, and falls. Rarely, surgery may be needed to remove large blood clots or to open up blocked arteries. HOME CARE INSTRUCTIONS   Take medicines only as directed by your health care provider. Follow the directions carefully. Medicines may be used to control risk factors for a stroke. Be sure you understand all your medicine instructions.  You may be told to take a medicine to thin the blood, such as aspirin or the anticoagulant warfarin. Warfarin needs to be taken exactly as instructed.  Too much and too little warfarin are both dangerous. Too much warfarin increases the risk of bleeding. Too little warfarin continues to allow the risk for blood clots. While taking warfarin, you will need to have regular blood tests to measure your blood clotting time. These blood tests usually include both the PT and INR tests. The PT and INR results allow your health care provider to adjust your dose of warfarin. The dose can change for many reasons. It is critically important that you take warfarin exactly as prescribed, and that you have your PT and INR levels drawn exactly as directed.  Many foods, especially foods high in vitamin K, can interfere with warfarin and affect the PT and INR results. Foods high in vitamin K include spinach, kale, broccoli, cabbage, collard and turnip greens, brussels sprouts, peas, cauliflower, seaweed, and parsley, as well as beef and pork liver, green tea, and soybean oil. You should eat a consistent amount of foods high in vitamin K. Avoid major changes in your diet, or notify your health care provider before changing your diet. Arrange a visit with a dietitian to answer your questions.  Many medicines can interfere with warfarin and affect the PT and INR results. You must tell your health care provider about any and all medicines you take. This  includes all vitamins and supplements. Be especially cautious with aspirin and anti-inflammatory medicines. Do not take or discontinue any prescribed or over-the-counter medicine except on the advice of your health care provider or pharmacist.  Warfarin can have side effects, such as excessive bruising or bleeding. You will need to hold pressure over cuts for longer than usual. Your health care provider or pharmacist will discuss other potential side effects.  Avoid sports or activities that may cause injury or bleeding.  Be mindful when shaving, flossing your teeth, or handling sharp objects.  Alcohol can change the body's ability to handle warfarin. It is best to avoid alcoholic drinks or consume only very small amounts while taking warfarin. Notify your health care provider if you change your alcohol intake.  Notify your dentist or other health care providers before procedures.  If swallow studies have determined that your swallowing reflex is present, you should eat healthy foods. Including 5 or more servings of fruits and vegetables a day may reduce the risk of stroke. Foods may need to be a certain consistency (soft or pureed), or small bites may need to be taken in order to avoid aspirating or choking. Certain dietary changes may be advised to address high blood pressure, high cholesterol, diabetes, or obesity.  Food choices that are low in sodium, saturated fat, trans fat, and cholesterol are recommended to manage high blood pressure.  Food choies that are high in fiber, and low in saturated fat, trans fat, and cholesterol may control cholesterol levels.  Controlling carbohydrates and sugar intake is recommended to manage diabetes.  Reducing calorie intake and making food choices that are low in sodium, saturated fat, trans fat, and cholesterol are recommended to manage obesity.  Maintain a healthy weight.  Stay physically active. It is recommended that you get at least 30 minutes of  activity on all or most days.  Do not use any tobacco products including cigarettes, chewing tobacco, or electronic cigarettes.  Limit alcohol use even if you are not taking warfarin. Moderate alcohol use is considered to be:  No more than 2 drinks each day for men.  No more than 1 drink each day for nonpregnant women.  Home safety. A safe home environment is important to reduce the risk of falls. Your health care provider may arrange for specialists to evaluate your home. Having grab bars in the bedroom and bathroom is often important. Your health care provider may arrange for equipment to be used at home, such as raised toilets and a seat for the shower.  Physical, occupational, and speech therapy. Ongoing therapy may be needed to maximize your recovery after a stroke. If you have been advised to use a walker or a cane, use it at all times. Be sure to keep your therapy appointments.  Follow all instructions for follow-up with your health care provider. This is very important. This includes any referrals, physical therapy, rehabilitation, and lab tests. Proper follow-up can prevent another stroke from occurring. SEEK MEDICAL CARE IF:  You have personality changes.  You have difficulty swallowing.  You are seeing double.  You have dizziness.  You have a fever.  You have skin breakdown. SEEK IMMEDIATE MEDICAL CARE IF:  Any of these symptoms may represent a serious problem that is an emergency. Do not wait to see if the symptoms will go away. Get medical help right away. Call your local emergency services (911 in [place] Do not drive yourself to the hospital.  You have sudden weakness or numbness of the face, arm, or leg, especially on one side of the body.  You have sudden trouble walking or difficulty moving arms or legs.  You have sudden confusion.  You have trouble speaking (aphasia) or understanding.  You have sudden trouble seeing in one or both eyes.  You have a loss of  balance or coordination.  You have a sudden, severe headache with no known cause.  You have new chest pain or an irregular heartbeat.  You have a partial or total loss of consciousness. Document Released: 09/03/2005 Document Revised: 01/18/2014 Document Reviewed: 04/13/2012 [facility] Patient Information 2015 [place], [place]. This information is not intended to replace advice given to you by your health care provider. Make sure you discuss any questions you have with your health care provider. Dyslipidemia Dyslipidemia is an imbalance of the lipids in your blood. Lipids are waxy, fat-like proteins that your body needs in small amounts. Dyslipidemia often involves the lipids cholesterol or triglycerides. Common forms of dyslipidemia are:  High levels of bad cholesterol (LDL cholesterol). LDL cholesterol is the type of cholesterol that causes heart disease.  Low levels of good cholesterol (HDL cholesterol). HDL cholesterol is the type of cholesterol that helps protect against heart disease.  High levels of triglycerides. Triglycerides are a fatty substance in the blood linked to a buildup of plaque on your arteries. RISK FACTORS  Increased age.  Having  a family history of high cholesterol.  Certain medicines, including birth control pills, diuretics, beta-blockers, and some medicines for depression.  Smoking.  Eating a high-fat diet.  Being overweight.  Medical conditions such as diabetes, polycystic ovary syndrome, pregnancy, kidney disease, and hypothyroidism.  Lack of regular exercise. SIGNS AND SYMPTOMS There are no signs or symptoms with dyslipidemia.  DIAGNOSIS  A simple blood test called a fasting blood test can be done to determine your level of:  Total cholesterol. This is the combined number of LDL cholesterol and HDL cholesterol. A healthy number is lower than 200.  LDL cholesterol. The goal number for LDL cholesterol is different for each person depending on risk  factors. Ask your health care provider what your LDL cholesterol number should be.  HDL cholesterol. A healthy level of HDL cholesterol is 60 or higher. A number lower than 40 for men or 50 for women is a danger sign.  Triglycerides. A healthy triglyceride number is less than 150. TREATMENT  Dyslipidemia is a treatable condition. Your health care provider will advise you on what type of treatment is best based on your age, your test results, and current guidelines. Treatment may include:   Dietary changes. A dietitian can help you create a meal plan. You may need to:  Eat more foods that contain omega-3s, such as salmon and other fish.  Replace saturated fats and trans fats in your diet with healthy fats such as nuts, seeds, avocados, olive oil, and canola oil.  Regular exercise. This can help lower your LDL cholesterol, raise your HDL cholesterol, and help with weight management. Check with your health care provider before beginning an exercise program. Most people should participate in 30 minutes of brisk exercise 5 days a week.  Quitting smoking.  Medicines to lower LDL cholesterol and triglycerides. Your health care provider will monitor your lipid levels with regular blood tests. HOME CARE INSTRUCTIONS  Eat a healthy diet. Follow any diet instructions if they were given to you by your health care provider.  Maintain a healthy weight.  Exercise regularly based on the recommendations of your health care provider.  Do not use any tobacco products, including cigarettes, chewing tobacco, or electronic cigarettes.  Take medicines only as directed by your health care provider.  Keep all follow-up visits as directed by your health care provider. SEEK MEDICAL CARE IF: You are having possible side effects from your medicines. Document Released: 09/08/2013 Document Revised: 01/18/2014 Document Reviewed: 09/08/2013 [facility] Patient Information 2015 [place]. This information is  not intended to replace advice given to you by your health care provider. Make sure you discuss any questions you have with your health care provider.

## 2014-12-22 ENCOUNTER — Telehealth: Payer: Self-pay

## 2014-12-22 NOTE — Telephone Encounter (Signed)
Patient is aware of his lab results 

## 2014-12-22 NOTE — Telephone Encounter (Signed)
-----   Message from Quentin Angstlugbemiga E Jegede, MD sent at 12/17/2014  6:26 PM EDT ----- Please inform patient that his laboratory test results are normal except for cholesterol level that is high, continue atorvastatin as prescribed, please limit saturated fat to no more than 7% of your calories, limit cholesterol to 200 mg/day, increase fiber and exercise as tolerated.

## 2014-12-23 ENCOUNTER — Telehealth: Payer: Self-pay | Admitting: Internal Medicine

## 2014-12-23 ENCOUNTER — Telehealth: Payer: Self-pay | Admitting: *Deleted

## 2014-12-23 NOTE — Telephone Encounter (Signed)
Patient Called to speak to nurse about his medications, patient is currently on cholesterol medication and wanted to know if he could take vitamins. Please f/u with pt.

## 2014-12-23 NOTE — Telephone Encounter (Signed)
Patient called to ask if it was safe to take a multivitamin with his Lipitor.  Spoke with USG CorporationChristan Pharm.D. She advised it was safe and to have patient take the vitamin either with breakfast o

## 2014-12-23 NOTE — Telephone Encounter (Signed)
Or at bedtime and to take the Lipitor at bedtime.

## 2015-01-04 ENCOUNTER — Ambulatory Visit: Payer: Self-pay | Admitting: Internal Medicine

## 2015-01-10 ENCOUNTER — Encounter: Payer: Self-pay | Admitting: Internal Medicine

## 2015-01-10 ENCOUNTER — Telehealth: Payer: Self-pay | Admitting: Internal Medicine

## 2015-01-10 ENCOUNTER — Ambulatory Visit: Payer: Self-pay | Attending: Internal Medicine | Admitting: Internal Medicine

## 2015-01-10 VITALS — BP 144/87 | HR 71 | Temp 98.0°F | Resp 16 | Ht 71.0 in | Wt 158.0 lb

## 2015-01-10 DIAGNOSIS — I639 Cerebral infarction, unspecified: Secondary | ICD-10-CM

## 2015-01-10 DIAGNOSIS — R030 Elevated blood-pressure reading, without diagnosis of hypertension: Secondary | ICD-10-CM

## 2015-01-10 DIAGNOSIS — Z1211 Encounter for screening for malignant neoplasm of colon: Secondary | ICD-10-CM

## 2015-01-10 HISTORY — DX: Elevated blood-pressure reading, without diagnosis of hypertension: R03.0

## 2015-01-10 NOTE — Patient Instructions (Signed)
DASH Eating Plan °DASH stands for "Dietary Approaches to Stop Hypertension." The DASH eating plan is a healthy eating plan that has been shown to reduce high blood pressure (hypertension). Additional health benefits may include reducing the risk of type 2 diabetes mellitus, heart disease, and stroke. The DASH eating plan may also help with weight loss. °WHAT DO I NEED TO KNOW ABOUT THE DASH EATING PLAN? °For the DASH eating plan, you will follow these general guidelines: °· Choose foods with a percent daily value for sodium of less than 5% (as listed on the food label). °· Use salt-free seasonings or herbs instead of table salt or sea salt. °· Check with your health care provider or pharmacist before using salt substitutes. °· Eat lower-sodium products, often labeled as "lower sodium" or "no salt added." °· Eat fresh foods. °· Eat more vegetables, fruits, and low-fat dairy products. °· Choose whole grains. Look for the word "whole" as the first word in the ingredient list. °· Choose fish and skinless chicken or turkey more often than red meat. Limit fish, poultry, and meat to 6 oz (170 g) each day. °· Limit sweets, desserts, sugars, and sugary drinks. °· Choose heart-healthy fats. °· Limit cheese to 1 oz (28 g) per day. °· Eat more home-cooked food and less restaurant, buffet, and fast food. °· Limit fried foods. °· Cook foods using methods other than frying. °· Limit canned vegetables. If you do use them, rinse them well to decrease the sodium. °· When eating at a restaurant, ask that your food be prepared with less salt, or no salt if possible. °WHAT FOODS CAN I EAT? °Seek help from a dietitian for individual calorie needs. °Grains °Whole grain or whole wheat bread. Brown rice. Whole grain or whole wheat pasta. Quinoa, bulgur, and whole grain cereals. Low-sodium cereals. Corn or whole wheat flour tortillas. Whole grain cornbread. Whole grain crackers. Low-sodium crackers. °Vegetables °Fresh or frozen vegetables  (raw, steamed, roasted, or grilled). Low-sodium or reduced-sodium tomato and vegetable juices. Low-sodium or reduced-sodium tomato sauce and paste. Low-sodium or reduced-sodium canned vegetables.  °Fruits °All fresh, canned (in natural juice), or frozen fruits. °Meat and Other Protein Products °Ground beef (85% or leaner), grass-fed beef, or beef trimmed of fat. Skinless chicken or turkey. Ground chicken or turkey. Pork trimmed of fat. All fish and seafood. Eggs. Dried beans, peas, or lentils. Unsalted nuts and seeds. Unsalted canned beans. °Dairy °Low-fat dairy products, such as skim or 1% milk, 2% or reduced-fat cheeses, low-fat ricotta or cottage cheese, or plain low-fat yogurt. Low-sodium or reduced-sodium cheeses. °Fats and Oils °Tub margarines without trans fats. Light or reduced-fat mayonnaise and salad dressings (reduced sodium). Avocado. Safflower, olive, or canola oils. Natural peanut or almond butter. °Other °Unsalted popcorn and pretzels. °The items listed above may not be a complete list of recommended foods or beverages. Contact your dietitian for more options. °WHAT FOODS ARE NOT RECOMMENDED? °Grains °White bread. White pasta. White rice. Refined cornbread. Bagels and croissants. Crackers that contain trans fat. °Vegetables °Creamed or fried vegetables. Vegetables in a cheese sauce. Regular canned vegetables. Regular canned tomato sauce and paste. Regular tomato and vegetable juices. °Fruits °Dried fruits. Canned fruit in light or heavy syrup. Fruit juice. °Meat and Other Protein Products °Fatty cuts of meat. Ribs, chicken wings, bacon, sausage, bologna, salami, chitterlings, fatback, hot dogs, bratwurst, and packaged luncheon meats. Salted nuts and seeds. Canned beans with salt. °Dairy °Whole or 2% milk, cream, half-and-half, and cream cheese. Whole-fat or sweetened yogurt. Full-fat   cheeses or blue cheese. Nondairy creamers and whipped toppings. Processed cheese, cheese spreads, or cheese  curds. °Condiments °Onion and garlic salt, seasoned salt, table salt, and sea salt. Canned and packaged gravies. Worcestershire sauce. Tartar sauce. Barbecue sauce. Teriyaki sauce. Soy sauce, including reduced sodium. Steak sauce. Fish sauce. Oyster sauce. Cocktail sauce. Horseradish. Ketchup and mustard. Meat flavorings and tenderizers. Bouillon cubes. Hot sauce. Tabasco sauce. Marinades. Taco seasonings. Relishes. °Fats and Oils °Butter, stick margarine, lard, shortening, ghee, and bacon fat. Coconut, palm kernel, or palm oils. Regular salad dressings. °Other °Pickles and olives. Salted popcorn and pretzels. °The items listed above may not be a complete list of foods and beverages to avoid. Contact your dietitian for more information. °WHERE CAN I FIND MORE INFORMATION? °[employer], [employer], [employer]: www.nhlbi.nih.gov/health/health-topics/topics/dash/ °Document Released: 08/23/2011 Document Revised: 01/18/2014 Document Reviewed: 07/08/2013 °[employer]® Patient Information ©2015 [employer]. This information is not intended to replace advice given to you by your health care provider. Make sure you discuss any questions you have with your health care provider. ° °

## 2015-01-10 NOTE — Telephone Encounter (Signed)
Patient picked up completed application for disability parking placard.

## 2015-01-10 NOTE — Progress Notes (Signed)
Pt is here following up on his stroke and his hyperlipidemia. Pt states that he gets occasional pain in his neck and the bones pop and get stuck in a cramped position sometimes.

## 2015-01-10 NOTE — Progress Notes (Signed)
Patient ID: Justin Odom, male   DOB: 03-Dec-1955, 59 y.o.   MRN: [id number]   Justin Odom, is a 59 y.o. male  [id number]  [id number]  DOB - April 17, 1956  Chief Complaint  Patient presents with  . Follow-up        Subjective:   Justin Odom is a 59 y.o. male here following up on his stroke and his hyperlipidemia. Pt states that he gets occasional pain in his neck and the bones pop and get stuck in a cramped position sometimes. He is getting better with his strength but still needs some physical therapy. He needs a new referral to PT. Patient has No headache, No chest pain, No abdominal pain - No Nausea, No new weakness tingling or numbness, No Cough - SOB. Patient has not had colonoscopy.  Problem  Borderline High Blood Pressure    ALLERGIES: No Known Allergies  PAST MEDICAL HISTORY: Past Medical History  Diagnosis Date  . CVA (cerebral infarction)   . Hyperlipidemia     MEDICATIONS AT HOME: Prior to Admission medications   Medication Sig Start Date End Date Taking? Authorizing Provider  aspirin EC 325 MG tablet Take 1 tablet (325 mg total) by mouth daily. 11/29/14  Yes Quentin Angst, MD  atorvastatin (LIPITOR) 40 MG tablet Take 1 tablet (40 mg total) by mouth daily. 11/29/14  Yes Quentin Angst, MD  Multiple Vitamins-Minerals (MULTIVITAMIN WITH MINERALS) tablet Take 1 tablet by mouth daily.   Yes Historical Provider, MD  doxycycline (VIBRA-TABS) 100 MG tablet Take 1 tablet (100 mg total) by mouth 2 (two) times daily. Patient not taking: Reported on 11/29/2014 10/04/14   Jonita Albee, MD  mupirocin ointment (BACTROBAN) 2 % Apply 1 application topically 3 (three) times daily. Patient not taking: Reported on 11/29/2014 10/04/14   Jonita Albee, MD  valACYclovir (VALTREX) 1000 MG tablet Take 1 tablet (1,000 mg total) by mouth 2 (two) times daily. Patient not taking: Reported on 11/29/2014 10/04/14   Jonita Albee, MD     Objective:   Filed Vitals:   01/10/15  1537  BP: 144/87  Pulse: 71  Temp: 98 F (36.7 C)  TempSrc: Oral  Resp: 16  Height:  (1.803 m)  Weight: 158 lb (71.668 kg)  SpO2: 100%    Exam General appearance : Awake, alert, not in any distress. Speech Clear. Not toxic looking HEENT: Atraumatic and Normocephalic, pupils equally reactive to light and accomodation Neck: supple, no JVD. No cervical lymphadenopathy.  Chest:Good air entry bilaterally, no added sounds  CVS: S1 S2 regular, no murmurs.  Abdomen: Bowel sounds present, Non tender and not distended with no gaurding, rigidity or rebound. Extremities: B/L Lower Ext shows no edema, both legs are warm to touch Neurology: Awake alert, and oriented X 3, CN II-XII intact, Non focal Skin:No Rash  Data Review Lab Results  Component Value Date   HGBA1C 5.10 11/29/2014     Assessment & Plan   1. CVA (cerebral vascular accident)  - Ambulatory referral to Physical Therapy  2. Borderline high blood pressure  We have discussed target BP range and blood pressure goal. I have advised patient to check BP regularly and to call us back or report to clinic if the numbers are consistently higher than 140/90. We discussed the importance of compliance with medical therapy and DASH diet recommended, consequences of uncontrolled hypertension discussed.   3. Colon cancer screening  - HM COLONOSCOPY - Ambulatory referral to Gastroenterology  Patient have been  counseled extensively about nutrition and exercise  Return in about 4 weeks (around 02/07/2015) for Follow up HTN, Follow up Pain and comorbidities.  The patient was given clear instructions to go to ER or return to medical center if symptoms don't improve, worsen or new problems develop. The patient verbalized understanding. The patient was told to call to get lab results if they haven't heard anything in the next week.   This note has been created with Education officer, environmentalDragon speech recognition software and smart phrase technology. Any  transcriptional errors are unintentional.    Jeanann LewandowskyJEGEDE, Shelaine Frie, MD, MHA, CPE, FACP, FAAP [facility] [place], [place] [phone]   01/10/2015, 4:12 PM

## 2015-01-20 ENCOUNTER — Ambulatory Visit (INDEPENDENT_AMBULATORY_CARE_PROVIDER_SITE_OTHER): Payer: Self-pay | Admitting: Family Medicine

## 2015-01-20 VITALS — BP 124/80 | HR 72 | Temp 98.0°F | Resp 16 | Ht 71.0 in | Wt 159.0 lb

## 2015-01-20 DIAGNOSIS — H60391 Other infective otitis externa, right ear: Secondary | ICD-10-CM

## 2015-01-20 DIAGNOSIS — H9201 Otalgia, right ear: Secondary | ICD-10-CM

## 2015-01-20 MED ORDER — NEOMYCIN-POLYMYXIN-HC 3.5-10000-1 OT SOLN: 4.0000 [drp] | Freq: Three times a day (TID) | OTIC | Status: DC

## 2015-01-20 NOTE — Progress Notes (Addendum)
Subjective:    Patient ID: Justin Odom, male    DOB: 1956-05-21, 59 y.o.   MRN: [id number] This chart was scribed for Meredith Staggers, MD by [place] Peace, ED Scribe. The patient was seen in [facility]09. The patient's care was started at 10:24 AM.  Chief Complaint  Patient presents with  . Ear Pain    Right/ onset 1 week     HPI  HPI Comments: Justin Odom is a 59 y.o. male who presents to the [facility] complaining of right ear pain x 1 week. He denies any changes in hearing, drainage, or blood from affected ear. No complaints of dental pain, congestion, or fever. He states that he has been using Q-tips to clean out ear without relief. Pt reports history of Stroke at the end of March that resulted in acute left hemiparesis. Pt is former smoker.    Patient Active Problem List   Diagnosis Date Noted  . Borderline high blood pressure 01/10/2015  . CVA (cerebral vascular accident) 11/29/2014  . Acute left hemiparesis 11/29/2014   Past Medical History  Diagnosis Date  . CVA (cerebral infarction)   . Hyperlipidemia    No past surgical history on file. No Known Allergies Prior to Admission medications   Medication Sig Start Date End Date Taking? Authorizing Provider  aspirin EC 325 MG tablet Take 1 tablet (325 mg total) by mouth daily. 11/29/14  Yes Quentin Angst, MD  atorvastatin (LIPITOR) 40 MG tablet Take 1 tablet (40 mg total) by mouth daily. 11/29/14  Yes Quentin Angst, MD  Multiple Vitamins-Minerals (MULTIVITAMIN WITH MINERALS) tablet Take 1 tablet by mouth daily.   Yes Historical Provider, MD  valACYclovir (VALTREX) 1000 MG tablet Take 1 tablet (1,000 mg total) by mouth 2 (two) times daily. Patient not taking: Reported on 11/29/2014 10/04/14   Jonita Albee, MD   History   Social History  . Marital Status: Divorced    Spouse Name: N/A  . Number of Children: N/A  . Years of Education: N/A   Occupational History  . Not on file.   Social History Main Topics  . Smoking  status: Former Smoker -- 1.00 packs/day    Types: Cigarettes    Quit date: 12/21/2014  . Smokeless tobacco: Not on file     Comment: states he quit last week   . Alcohol Use: 0.0 oz/week    0 Standard drinks or equivalent per week     Comment: occ  . Drug Use: Yes    Special: Marijuana  . Sexual Activity: Not on file   Other Topics Concern  . Not on file   Social History Narrative        Review of Systems  Constitutional: Negative for fever.  HENT: Positive for ear pain. Negative for congestion, dental problem and ear discharge.        Objective:   Physical Exam  Constitutional: He is oriented to person, place, and time. He appears well-developed and well-nourished.  HENT:  Head: Normocephalic and atraumatic.  Right Ear: External ear and ear canal normal. No swelling or tenderness. Tympanic membrane is erythematous.  Left Ear: Tympanic membrane, external ear and ear canal normal. No tenderness.  Nose: No rhinorrhea.  Mouth/Throat: Oropharynx is clear and moist and mucous membranes are normal. No oropharyngeal exudate or posterior oropharyngeal erythema.  Left Ear- Slightly narrow but non-tender. Slight cerumen but TM appears pearly gray.  Right  Ear- non-tender. No swelling. Edematis without exudate. Slight erythema. TM appears  pearly gray. No discharge or rupture.   Eyes: Conjunctivae are normal. Pupils are equal, round, and reactive to light.  Neck: Neck supple.  Cardiovascular: Normal rate, regular rhythm, normal heart sounds and intact distal pulses.   No murmur heard. Pulmonary/Chest: Effort normal and breath sounds normal. He has no wheezes. He has no rhonchi. He has no rales.  Abdominal: Soft. There is no tenderness.  Lymphadenopathy:    He has no cervical adenopathy.  Neurological: He is alert and oriented to person, place, and time.  Skin: Skin is warm and dry. No rash noted.  Psychiatric: He has a normal mood and affect. His behavior is normal.  Vitals  reviewed.   Filed Vitals:   01/20/15 0848  BP: 124/80  Pulse: 72  Temp: 98 F (36.7 C)  TempSrc: Oral  Resp: 16  Height: 5\' 11"  (1.803 m)  Weight: 159 lb (72.122 kg)     10:29 AM- Treatment plan was discussed with patient who verbalizes understanding and agrees.       Assessment & Plan:   Justin Odom is a 59 y.o. male Otalgia of right ear - Plan: neomycin-polymyxin-hydrocortisone (CORTISPORIN) otic solution  Otitis, externa, infective, right - Plan: neomycin-polymyxin-hydrocortisone (CORTISPORIN) otic solution Early otitis externa with canal edema.   -start cortisporin otic, rtc precautions given.   Meds ordered this encounter  Medications  . neomycin-polymyxin-hydrocortisone (CORTISPORIN) otic solution    Sig: Place 4 drops into the right ear 3 (three) times daily.    Dispense:  10 mL    Refill:  0   Patient Instructions  Start ear drops. If not improving in next 3-4 days - recheck. Return to the clinic or go to the nearest emergency room if any of your symptoms worsen or new symptoms occur.  Otitis Externa Otitis externa is a bacterial or fungal infection of the outer ear canal. This is the area from the eardrum to the outside of the ear. Otitis externa is sometimes called "swimmer's ear." CAUSES  Possible causes of infection include:  Swimming in dirty water.  Moisture remaining in the ear after swimming or bathing.  Mild injury (trauma) to the ear.  Objects stuck in the ear (foreign body).  Cuts or scrapes (abrasions) on the outside of the ear. SIGNS AND SYMPTOMS  The first symptom of infection is often itching in the ear canal. Later signs and symptoms may include swelling and redness of the ear canal, ear pain, and yellowish-white fluid (pus) coming from the ear. The ear pain may be worse when pulling on the earlobe. DIAGNOSIS  Your health care provider will perform a physical exam. A sample of fluid may be taken from the ear and examined for bacteria  or fungi. TREATMENT  Antibiotic ear drops are often given for 10 to 14 days. Treatment may also include pain medicine or corticosteroids to reduce itching and swelling. HOME CARE INSTRUCTIONS   Apply antibiotic ear drops to the ear canal as prescribed by your health care provider.  Take medicines only as directed by your health care provider.  If you have diabetes, follow any additional treatment instructions from your health care provider.  Keep all follow-up visits as directed by your health care provider. PREVENTION   Keep your ear dry. Use the corner of a towel to absorb water out of the ear canal after swimming or bathing.  Avoid scratching or putting objects inside your ear. This can damage the ear canal or remove the protective wax that lines the canal.  This makes it easier for bacteria and fungi to grow.  Avoid swimming in lakes, polluted water, or poorly chlorinated pools.  You may use ear drops made of rubbing alcohol and vinegar after swimming. Combine equal parts of white vinegar and alcohol in a bottle. Put 3 or 4 drops into each ear after swimming. SEEK MEDICAL CARE IF:   You have a fever.  Your ear is still red, swollen, painful, or draining pus after 3 days.  Your redness, swelling, or pain gets worse.  You have a severe headache.  You have redness, swelling, pain, or tenderness in the area behind your ear. MAKE SURE YOU:   Understand these instructions.  Will watch your condition.  Will get help right away if you are not doing well or get worse. Document Released: 09/03/2005 Document Revised: 01/18/2014 Document Reviewed: 09/20/2011 [facility] Patient Information 2015 [place], [place]. This information is not intended to replace advice given to you by your health care provider. Make sure you discuss any questions you have with your health care provider.     I personally performed the services described in this documentation, which was scribed in my presence.  The recorded information has been reviewed and considered, and addended by me as needed.

## 2015-01-20 NOTE — Patient Instructions (Signed)
Start ear drops. If not improving in next 3-4 days - recheck. Return to the clinic or go to the nearest emergency room if any of your symptoms worsen or new symptoms occur.  Otitis Externa Otitis externa is a bacterial or fungal infection of the outer ear canal. This is the area from the eardrum to the outside of the ear. Otitis externa is sometimes called "swimmer's ear." CAUSES  Possible causes of infection include:  Swimming in dirty water.  Moisture remaining in the ear after swimming or bathing.  Mild injury (trauma) to the ear.  Objects stuck in the ear (foreign body).  Cuts or scrapes (abrasions) on the outside of the ear. SIGNS AND SYMPTOMS  The first symptom of infection is often itching in the ear canal. Later signs and symptoms may include swelling and redness of the ear canal, ear pain, and yellowish-white fluid (pus) coming from the ear. The ear pain may be worse when pulling on the earlobe. DIAGNOSIS  Your health care provider will perform a physical exam. A sample of fluid may be taken from the ear and examined for bacteria or fungi. TREATMENT  Antibiotic ear drops are often given for 10 to 14 days. Treatment may also include pain medicine or corticosteroids to reduce itching and swelling. HOME CARE INSTRUCTIONS   Apply antibiotic ear drops to the ear canal as prescribed by your health care provider.  Take medicines only as directed by your health care provider.  If you have diabetes, follow any additional treatment instructions from your health care provider.  Keep all follow-up visits as directed by your health care provider. PREVENTION   Keep your ear dry. Use the corner of a towel to absorb water out of the ear canal after swimming or bathing.  Avoid scratching or putting objects inside your ear. This can damage the ear canal or remove the protective wax that lines the canal. This makes it easier for bacteria and fungi to grow.  Avoid swimming in lakes,  polluted water, or poorly chlorinated pools.  You may use ear drops made of rubbing alcohol and vinegar after swimming. Combine equal parts of white vinegar and alcohol in a bottle. Put 3 or 4 drops into each ear after swimming. SEEK MEDICAL CARE IF:   You have a fever.  Your ear is still red, swollen, painful, or draining pus after 3 days.  Your redness, swelling, or pain gets worse.  You have a severe headache.  You have redness, swelling, pain, or tenderness in the area behind your ear. MAKE SURE YOU:   Understand these instructions.  Will watch your condition.  Will get help right away if you are not doing well or get worse. Document Released: 09/03/2005 Document Revised: 01/18/2014 Document Reviewed: 09/20/2011 [facility] Patient Information 2015 [place], [place]. This information is not intended to replace advice given to you by your health care provider. Make sure you discuss any questions you have with your health care provider.

## 2015-01-22 ENCOUNTER — Encounter: Payer: Self-pay | Admitting: Family Medicine

## 2015-01-24 ENCOUNTER — Telehealth: Payer: Self-pay

## 2015-01-24 NOTE — Telephone Encounter (Signed)
Patient can continue applying the drops, if there are still symptoms.  He should not use the antibiotic past 10 days.

## 2015-01-24 NOTE — Telephone Encounter (Signed)
Spoke with pt. He was on placed ear drops. He states his ear has gotten better, but he wondering if he should use the abx drops until they're all gone because on the bottle it says "continued use can cause another infection". He wasn't sure what he should do. Please advise.

## 2015-01-24 NOTE — Telephone Encounter (Signed)
Pt.notified

## 2015-01-24 NOTE — Telephone Encounter (Signed)
Pt would like to speak with someone regarding his medication. Please call [phone]

## 2015-01-24 NOTE — Telephone Encounter (Signed)
lmom to cb. 

## 2015-01-26 ENCOUNTER — Telehealth: Payer: Self-pay | Admitting: *Deleted

## 2015-01-26 NOTE — Telephone Encounter (Signed)
Patient called in asking for nurse to call him.  Called him back and there was no answer.  Left HIPPA compliant message.

## 2015-02-07 ENCOUNTER — Ambulatory Visit: Payer: Self-pay | Admitting: Internal Medicine

## 2015-02-10 ENCOUNTER — Ambulatory Visit: Payer: Self-pay | Attending: Internal Medicine | Admitting: Internal Medicine

## 2015-02-10 ENCOUNTER — Encounter: Payer: Self-pay | Admitting: Internal Medicine

## 2015-02-10 VITALS — BP 113/75 | HR 67 | Temp 97.4°F | Resp 16 | Ht 71.0 in | Wt 159.0 lb

## 2015-02-10 DIAGNOSIS — H9209 Otalgia, unspecified ear: Secondary | ICD-10-CM | POA: Insufficient documentation

## 2015-02-10 DIAGNOSIS — I639 Cerebral infarction, unspecified: Secondary | ICD-10-CM | POA: Insufficient documentation

## 2015-02-10 DIAGNOSIS — H9202 Otalgia, left ear: Secondary | ICD-10-CM | POA: Insufficient documentation

## 2015-02-10 NOTE — Progress Notes (Signed)
Patient ID: Justin Odom, male   DOB: 02-02-1956, 59 y.o.   MRN: [id number]   Justin Odom, is a 59 y.o. male  [id number]  [id number]  DOB - [birth year]  Chief Complaint  Patient presents with  . Follow-up  . Hypertension        Subjective:   Justin Odom is a 59 y.o. male here today for a follow up visit.  Patient had cerebrovascular accident recently , here today for follow-up. Patient is doing much better, his continues to work with physical therapist. He has no new complaints today. He feels very motivated because of clinical improvement. No longer smokes. He was recently seen in urgent care and treated for right ear pain. Patient has No headache, No chest pain, No abdominal pain - No Nausea, No new weakness tingling or numbness, No Cough - SOB.  Problem  Otalgia    ALLERGIES: No Known Allergies  PAST MEDICAL HISTORY: Past Medical History  Diagnosis Date  . CVA (cerebral infarction)   . Hyperlipidemia     MEDICATIONS AT HOME: Prior to Admission medications   Medication Sig Start Date End Date Taking? Authorizing Provider  aspirin EC 325 MG tablet Take 1 tablet (325 mg total) by mouth daily. 11/29/14  Yes Quentin Angst, MD  atorvastatin (LIPITOR) 40 MG tablet Take 1 tablet (40 mg total) by mouth daily. 11/29/14  Yes Quentin Angst, MD  Multiple Vitamins-Minerals (MULTIVITAMIN WITH MINERALS) tablet Take 1 tablet by mouth daily.   Yes Historical Provider, MD  neomycin-polymyxin-hydrocortisone (CORTISPORIN) otic solution Place 4 drops into the right ear 3 (three) times daily. Patient not taking: Reported on 02/10/2015 01/20/15   Shade Flood, MD  valACYclovir (VALTREX) 1000 MG tablet Take 1 tablet (1,000 mg total) by mouth 2 (two) times daily. Patient not taking: Reported on 11/29/2014 10/04/14   Jonita Albee, MD     Objective:   Filed Vitals:   02/10/15 0913  BP: 113/75  Pulse: 67  Temp: 97.4 F (36.3 C)  TempSrc: Oral  Resp: 16  Height: 5'  11" (1.803 m)  Weight: 159 lb (72.122 kg)  SpO2: 97%    Exam General appearance : Awake, alert, not in any distress. Speech Clear. Not toxic looking HEENT: Atraumatic and Normocephalic, pupils equally reactive to light and accomodation Neck: supple, no JVD. No cervical lymphadenopathy.  Chest:Good air entry bilaterally, no added sounds  CVS: S1 S2 regular, no murmurs.  Abdomen: Bowel sounds present, Non tender and not distended with no gaurding, rigidity or rebound. Extremities: B/L Lower Ext shows no edema, both legs are warm to touch Neurology: Awake alert, and oriented X 3, CN II-XII intact, Non focal Skin:No Rash  Data Review Lab Results  Component Value Date   HGBA1C 5.10 11/29/2014     Assessment & Plan   1. CVA (cerebral vascular accident) Continue PT/OT and medications  2. Otalgia, left  Return to work 02/16/2015  Patient have been counseled extensively about nutrition and exercise  Return in about 3 months (around 05/13/2015) for Follow up HTN.  The patient was given clear instructions to go to ER or return to medical center if symptoms don't improve, worsen or new problems develop. The patient verbalized understanding. The patient was told to call to get lab results if they haven't heard anything in the next week.   This note has been created with Education officer, environmental. Any transcriptional errors are unintentional.    Sugey Trevathan, MD, MHA,  CPE, Maxwell CaulFACP, FAAP [facility], [place] [phone]   02/10/2015, 9:46 AM

## 2015-02-10 NOTE — Progress Notes (Signed)
HFU Stroke on Jan 24, 2015 Hx no tobacco user-stopped smoking 2 weeks ago

## 2015-02-10 NOTE — Patient Instructions (Signed)

## 2015-02-21 ENCOUNTER — Ambulatory Visit: Payer: 59 | Attending: Internal Medicine | Admitting: Physical Therapy

## 2015-02-21 ENCOUNTER — Encounter: Payer: Self-pay | Admitting: Physical Therapy

## 2015-02-21 DIAGNOSIS — I69354 Hemiplegia and hemiparesis following cerebral infarction affecting left non-dominant side: Secondary | ICD-10-CM

## 2015-02-21 DIAGNOSIS — I69854 Hemiplegia and hemiparesis following other cerebrovascular disease affecting left non-dominant side: Secondary | ICD-10-CM | POA: Diagnosis not present

## 2015-02-21 NOTE — Patient Instructions (Signed)
Hamstring Stretch (Sitting)   Sitting, extend one leg and place hands on same thigh for support. Keeping torso straight, lean forward, sliding hands down leg, until a stretch is felt in back of thigh. Hold ____ seconds. Repeat with other leg.  Copyright  VHI. All rights reserved.  Ankle Plantar Flexion / Dorsiflexion, Standing   Stand while holding a stable object. Rise up on toes. Then rock back on heels. Hold each position ___ seconds. Repeat ___ times per session. Do ___ sessions per day. DO STANDING ON LEFT LEG ONLY Copyright  VHI. All rights reserved.

## 2015-02-21 NOTE — Therapy (Signed)
[facility] [address] [place], [place], [address] Phone: [phone]   Fax:  [phone]  Physical Therapy Evaluation  Patient Details  Name: Justin Odom MRN: [id number] Date of Birth: 09/29/1955 Referring Provider:  Quentin AngstJegede, Olugbemiga E, MD  Encounter Date: 02/21/2015      PT End of Session - 02/21/15 1558    Visit Number 1   Authorization Type self pay   PT Start Time 0800   PT Stop Time 0850   PT Time Calculation (min) 50 min      Past Medical History  Diagnosis Date  . CVA (cerebral infarction)   . Hyperlipidemia     History reviewed. No pertinent past surgical history.  There were no vitals filed for this visit.  Visit Diagnosis:  Hemiparesis affecting left side as late effect of cerebrovascular accident - Plan: PT plan of care cert/re-cert      Subjective Assessment - 02/21/15 1551    Subjective Pt. reports he suffered a R CVA in early March when he was in [place] on a business trip; states he kept falling toward the left; was admitted to hospital for 2 days - used wheelchair and RW intially; stated he did not have insurance so was not eligible for therapy so he "googled" and researched therapy for exercises and stated he "rehabbed" himself and has made significant progress; states main problem now is slight gait deviation with L leg not quite as strong as RLE                                                                                                                                                Patient Stated Goals Improve L leg strength and endurance   Currently in Pain? No/denies            [facility] - 02/21/15 [id number]    Assessment   Medical Diagnosis R CVA   Onset Date/Surgical Date --  early March 2016   Hand Dominance Right   Precautions   Precautions None   Balance Screen   Has the patient fallen in the past 6 months No   Has the patient had a decrease in activity level  because of a fear of falling?  No   Is the patient reluctant to leave their home because of a fear of falling?  No   Home Environment   Living Environment Private residence   Home Access Stairs to enter   Prior Function   Level of Independence Independent with household mobility without device   Vocation Part time employment   ROM / Strength   AROM / PROM / Strength Strength   Strength   Overall Strength Within functional limits for tasks performed  L gastroc weakness 3+/5; L hamstrings 4/5   Overall Strength Comments other muscle groups in LLE WNL's  Ambulation/Gait   Ambulation/Gait Yes   Ambulation/Gait Assistance 6: Modified independent (Device/Increase time)   Ambulation Distance (Feet) 150 Feet   Assistive device None   Gait Pattern Within Functional Limits   Ambulation Surface Level;Indoor   Gait velocity 4.15 ft/sec   Gait Comments pt. has L genu recurvatum due to weakness in gastrocs and hamstrings                           PT Education - 02/21/15 1556    Education provided Yes   Education Details INSTRUCTED IN HAMSTRING STRETCH FOR LLE AND HEEL RAISES - L SINGLE LIMB ONLY   Person(s) Educated Patient   Methods Explanation;Demonstration;Handout   Comprehension Verbalized understanding;Returned demonstration             PT Long Term Goals - 02/21/15 1620    PT LONG TERM GOAL #1   Title Independent in HEP for LLE strengthening.   Time 3   Period Weeks   Status New               Plan - 02/21/15 1603    Clinical Impression Statement Pt. presents with minimal gait deviation of L genu recurvatum in stance; pt. has no significant balance deficits   Pt will benefit from skilled therapeutic intervention in order to improve on the following deficits Abnormal gait;Decreased strength   Rehab Potential Good   PT Frequency 1x / week   PT Duration 3 weeks   PT Treatment/Interventions ADLs/Self Care Home Management;Therapeutic  activities;Therapeutic exercise;Neuromuscular re-education;Gait training   PT Next Visit Plan add knee control exercise  to HEP   PT Home Exercise Plan plantarflexion, hamstring stetching and  knee control exercise with theraband   Consulted and Agree with Plan of Care Patient         Problem List Patient Active Problem List   Diagnosis Date Noted  . Otalgia 02/10/2015  . Borderline high blood pressure 01/10/2015  . CVA (cerebral vascular accident) 11/29/2014  . Acute left hemiparesis 11/29/2014    DildayDonavan Burnet, PT 02/21/2015, 4:30 PM  [facility] [address] [place], [place], [address] Phone: [phone]   Fax:  [phone]

## 2015-02-28 ENCOUNTER — Ambulatory Visit: Payer: Self-pay | Admitting: Internal Medicine

## 2015-03-08 ENCOUNTER — Ambulatory Visit: Payer: 59 | Admitting: Physical Therapy

## 2015-03-15 ENCOUNTER — Encounter: Payer: Self-pay | Admitting: Physical Therapy

## 2015-03-15 ENCOUNTER — Ambulatory Visit: Payer: 59 | Admitting: Physical Therapy

## 2015-03-15 DIAGNOSIS — I69354 Hemiplegia and hemiparesis following cerebral infarction affecting left non-dominant side: Secondary | ICD-10-CM

## 2015-03-15 DIAGNOSIS — R29898 Other symptoms and signs involving the musculoskeletal system: Secondary | ICD-10-CM

## 2015-03-15 DIAGNOSIS — I69854 Hemiplegia and hemiparesis following other cerebrovascular disease affecting left non-dominant side: Secondary | ICD-10-CM | POA: Diagnosis not present

## 2015-03-15 NOTE — Patient Instructions (Signed)
Heel Raise: Bilateral (Standing)    Rise on balls of feet. Repeat ____ times per set. Do ____ sets per session. Do ____ sessions per day.  http://orth.exer.us/38   Copyright  VHI. All rights reserved.   

## 2015-03-17 ENCOUNTER — Encounter: Payer: Self-pay | Admitting: Physical Therapy

## 2015-03-17 NOTE — Therapy (Signed)
[facility] [address] [place], [place], [address] Phone: [phone]   Fax:  [phone]  Physical Therapy Treatment  Patient Details  Name: Justin Odom MRN: [id number] Date of Birth: 01-22-1956 Referring Provider:  Quentin Angst, MD  Encounter Date: 03/15/2015      PT End of Session - 03/17/15 1642    Visit Number 2   Authorization Type self pay   PT Start Time 0933   PT Stop Time 1015   PT Time Calculation (min) 42 min      Past Medical History  Diagnosis Date  . CVA (cerebral infarction)   . Hyperlipidemia     History reviewed. No pertinent past surgical history.  There were no vitals filed for this visit.  Visit Diagnosis:  Hemiparesis affecting left side as late effect of cerebrovascular accident  Left leg weakness      Subjective Assessment - 03/17/15 1641    Subjective Pt.reports he has been doing exercises some; has returned to work Tues-Sat 1-6:00; is doing better; wants exercises to do at [employer]   Patient Stated Goals Improve L leg strength and endurance   Currently in Pain? No/denies                         [facility] Adult PT Treatment/Exercise - 03/17/15 0001    Ambulation/Gait   Ambulation/Gait Yes   Ambulation/Gait Assistance 6: Modified independent (Device/Increase time)   Ambulation Distance (Feet) 150 Feet   Assistive device None   Gait Pattern Within Functional Limits   Ambulation Surface Level;Indoor   Gait Comments treadmill 1.5 mph with bil. UE's   Knee/Hip Exercises: Machines for Strengthening   Cybex Leg Press 70# bil. LE's 10 reps; LLE only 40# 12 reps      NeuroRe-ed;  Amb. On tip toes with knees flexed 30'x4 reps with SBA for L knee control            PT Education - 03/17/15 1642    Education provided Yes   Education Details added amb. on tip toes with knees flexed   Person(s) Educated Patient   Methods  Explanation;Demonstration   Comprehension Verbalized understanding;Returned demonstration             PT Long Term Goals - 02/21/15 1620    PT LONG TERM GOAL #1   Title Independent in HEP for LLE strengthening.   Time 3   Period Weeks   Status New               Plan - 03/17/15 1642    Clinical Impression Statement Pt. progressing well with improving strength in LLE;  min. occurrences of L knee hyperextension noted during session    Pt will benefit from skilled therapeutic intervention in order to improve on the following deficits Abnormal gait;Decreased strength   Rehab Potential Good   PT Frequency 1x / week   PT Duration 3 weeks   PT Treatment/Interventions ADLs/Self Care Home Management;Therapeutic activities;Therapeutic exercise;Neuromuscular re-education;Gait training   PT Next Visit Plan add knee control exercise  to HEP   PT Home Exercise Plan plantarflexion, hamstring stetching and  knee control exercise with theraband   Consulted and Agree with Plan of Care Patient        Problem List Patient Active Problem List   Diagnosis Date Noted  . Otalgia 02/10/2015  . Borderline high blood pressure 01/10/2015  . CVA (cerebral vascular accident) 11/29/2014  . Acute  left hemiparesis 11/29/2014    DildayDonavan Burnet, Ramyah Pankowski Suzanne, PT 03/17/2015, 4:53 PM  [facility] [address] [place], [place], [address] Phone: [phone]   Fax:  [phone]

## 2015-03-22 ENCOUNTER — Ambulatory Visit: Payer: 59 | Attending: Internal Medicine | Admitting: Physical Therapy

## 2015-03-22 DIAGNOSIS — I69854 Hemiplegia and hemiparesis following other cerebrovascular disease affecting left non-dominant side: Secondary | ICD-10-CM | POA: Diagnosis present

## 2015-03-22 DIAGNOSIS — I69354 Hemiplegia and hemiparesis following cerebral infarction affecting left non-dominant side: Secondary | ICD-10-CM

## 2015-03-22 DIAGNOSIS — R29898 Other symptoms and signs involving the musculoskeletal system: Secondary | ICD-10-CM | POA: Diagnosis present

## 2015-03-23 ENCOUNTER — Encounter: Payer: Self-pay | Admitting: Physical Therapy

## 2015-03-23 NOTE — Therapy (Signed)
[facility] [address] [place], [place], [address] Phone: [phone]   Fax:  [phone]  Physical Therapy Treatment  Patient Details  Name: Justin Odom MRN: [id number] Date of Birth: 1956-06-19 Referring Provider:  Quentin Angst, MD  Encounter Date: 03/22/2015      PT End of Session - 03/23/15 0819    Visit Number 3   Number of Visits 4   Date for PT Re-Evaluation 03/31/15   Authorization Type self pay   PT Start Time 1017   PT Stop Time 1101   PT Time Calculation (min) 44 min      Past Medical History  Diagnosis Date  . CVA (cerebral infarction)   . Hyperlipidemia     History reviewed. No pertinent past surgical history.  There were no vitals filed for this visit.  Visit Diagnosis:  Left leg weakness  Hemiparesis affecting left side as late effect of cerebrovascular accident      Subjective Assessment - 03/23/15 0812    Subjective Pt. reports he is getting stronger - states L knee hyper extension occurs much less often   Patient Stated Goals Improve L leg strength and endurance   Currently in Pain? No/denies                         [facility] Adult PT Treatment/Exercise - 03/23/15 0001    Knee/Hip Exercises: Machines for Strengthening   Cybex Leg Press 70# bil. LE's 10 reps; LLE only 40# 12 reps   Knee/Hip Exercises: Standing   Heel Raises 1 set;15 reps  closed chain off hi/o mat table- with LLE only   Terminal Knee Extension AROM;Left;3 sets;10 reps  with RLE in mid stance,forward & back stance positions   Theraband Level (Terminal Knee Extension) Level 4 (Blue)     Elliptical level 1.5 x 1" forward and 1" backward forward step ups with LLE 10 reps; step downs with RLE for improved LLE eccentric knee control x 10 reps       Balance Exercises - 03/23/15 0814    Balance Exercises: Standing   Sidestepping 3 reps  with red theraband sidestepping 30' with partial  squats - 2     Forward and backward stepping 30' x 1 with red theraband for balance and strengthening Alternate stepping on incline and on decline - with RLE - with LLE knee flexed for improved knee control - 10 reps RLE Stepping up/back and down/back      PT Education - 03/23/15 0818    Education Details added L knee control with blue theraband to HEP   Person(s) Educated Patient   Methods Explanation;Demonstration;Handout   Comprehension Verbalized understanding;Returned demonstration             PT Long Term Goals - 02/21/15 1620    PT LONG TERM GOAL #1   Title Independent in HEP for LLE strengthening.   Time 3   Period Weeks   Status New               Plan - 03/23/15 6962    Clinical Impression Statement Pt. has some clonus in LLE with fatigue; some minimal L knee hyperextension noted with fatigue also; pt progressing well towards LTG   Pt will benefit from skilled therapeutic intervention in order to improve on the following deficits Abnormal gait;Decreased strength   Rehab Potential Good   PT Frequency 1x / week   PT Duration 3 weeks  PT Treatment/Interventions ADLs/Self Care Home Management;Therapeutic activities;Therapeutic exercise;Neuromuscular re-education;Gait training   PT Next Visit Plan check LTG and D/C   PT Home Exercise Plan plantarflexion, hamstring stetching and  knee control exercise with theraband   Consulted and Agree with Plan of Care Patient        Problem List Patient Active Problem List   Diagnosis Date Noted  . Otalgia 02/10/2015  . Borderline high blood pressure 01/10/2015  . CVA (cerebral vascular accident) 11/29/2014  . Acute left hemiparesis 11/29/2014    DildayDonavan Burnet, Kathleena Freeman Suzanne, PT 03/23/2015, 8:23 AM  [facility] [address] [place], [place], [address] Phone: [phone]   Fax:  [phone]

## 2015-03-29 ENCOUNTER — Ambulatory Visit: Payer: 59 | Admitting: Physical Therapy

## 2015-03-29 ENCOUNTER — Encounter: Payer: Self-pay | Admitting: Physical Therapy

## 2015-03-29 DIAGNOSIS — I69354 Hemiplegia and hemiparesis following cerebral infarction affecting left non-dominant side: Secondary | ICD-10-CM

## 2015-03-29 DIAGNOSIS — R29898 Other symptoms and signs involving the musculoskeletal system: Secondary | ICD-10-CM

## 2015-03-30 ENCOUNTER — Encounter: Payer: Self-pay | Admitting: Physical Therapy

## 2015-03-30 NOTE — Therapy (Signed)
[facility] [address] [place], [place], [address] Phone: [phone]   Fax:  [phone]  Physical Therapy Treatment  Patient Details  Name: Justin Odom MRN: [id number] Date of Birth: 10/26/1955 Referring Provider:  Tresa Garter, MD  Encounter Date: 03/29/2015      PT End of Session - 03/30/15 2203    Visit Number 4   Number of Visits 4   Date for PT Re-Evaluation 03/31/15   Authorization Type [employer]   PT Start Time 0930   PT Stop Time 1017   PT Time Calculation (min) 47 min      Past Medical History  Diagnosis Date  . CVA (cerebral infarction)   . Hyperlipidemia     History reviewed. No pertinent past surgical history.  There were no vitals filed for this visit.  Visit Diagnosis:  Left leg weakness  Hemiparesis affecting left side as late effect of cerebrovascular accident      Subjective Assessment - 03/30/15 2157    Subjective "doing well"; pt reports he still has some LLE weakness but states it is improving   Patient Stated Goals Improve L leg strength and endurance   Currently in Pain? No/denies                         [facility] Adult PT Treatment/Exercise - 03/30/15 0001    Knee/Hip Exercises: Aerobic   Elliptical 1 1/2" forward and 1 1/2" backward level 1.5   Knee/Hip Exercises: Machines for Strengthening   Cybex Leg Press 70# bil. LE's 10 reps; LLE only 40# 12 reps   Knee/Hip Exercises: Standing   Heel Raises 1 set;15 reps  closed chain off hi/o mat table- with LLE only   Terminal Knee Extension AROM;Left;3 sets;10 reps  with RLE in mid stance,forward & back stance positions     TherAct; jogging 50' x 4 reps with cues to stay up on tip toes for plantarflexion strengthening - no LOB  LLE hip theraband exercises (green) for flexion, abduction, and extension x 10 reps each direction      Balance Exercises - 03/30/15 2201    Balance Exercises: Standing   Heel  Raises Limitations off hi/lo mat 20 reps - limitted by weakness     single limb stance activities for LLE with CGA - standing on floor - cone taps           PT Long Term Goals - 03/30/15 2205    PT LONG TERM GOAL #1   Title Independent in HEP for LLE strengthening.   Status Achieved               Plan - 03/30/15 2204    Clinical Impression Statement Pt. continues to have some mild weakness in L plantarflexors but is not affecting pt's functional status - pt able to jog approx. 67' without LOB   Rehab Potential Good   PT Frequency 1x / week   PT Duration 3 weeks   PT Treatment/Interventions ADLs/Self Care Home Management;Therapeutic activities;Therapeutic exercise;Neuromuscular re-education;Gait training   PT Home Exercise Plan plantarflexion, hamstring stetching and  knee control exercise with theraband   Consulted and Agree with Plan of Care Patient        Problem List Patient Active Problem List   Diagnosis Date Noted  . Otalgia 02/10/2015  . Borderline high blood pressure 01/10/2015  . CVA (cerebral vascular accident) 11/29/2014  . Acute left hemiparesis 11/29/2014  PHYSICAL THERAPY DISCHARGE  SUMMARY  Visits from Start of Care: 4  Current functional level related to goals / functional outcomes: See above - pt is ambulating independently without a device   Remaining deficits: Minimal L plantarflexor weakness   Education / Equipment: Pt. has been instructed in exercises for LLE strengthening; Pt has symptoms consistent with intermittent claudication in RLE - recommended that he discuss this with his MD for further Evaluation/diagnosis of pain with activity in R calf Plan: Patient agrees to discharge.  Patient goals were met. Patient is being discharged due to meeting the stated rehab goals.  ?????       Alda Lea, PT 03/30/2015, 10:09 PM  [facility] [address] [place], [place], [address] Phone: [phone]   Fax:  [phone]

## 2015-03-31 ENCOUNTER — Telehealth: Payer: Self-pay | Admitting: Internal Medicine

## 2015-03-31 NOTE — Telephone Encounter (Signed)
Pt would like to speak to nurse in regards to swtiching aspirin EC 325 MG tablet to Bufferin.  Please f/u with pt.

## 2015-04-15 ENCOUNTER — Telehealth: Payer: Self-pay | Admitting: Internal Medicine

## 2015-05-02 ENCOUNTER — Ambulatory Visit: Payer: 59 | Attending: Internal Medicine | Admitting: Internal Medicine

## 2015-05-02 ENCOUNTER — Encounter: Payer: Self-pay | Admitting: Internal Medicine

## 2015-05-02 VITALS — BP 132/83 | HR 65 | Temp 98.8°F | Resp 16 | Ht 71.0 in | Wt 153.0 lb

## 2015-05-02 DIAGNOSIS — E785 Hyperlipidemia, unspecified: Secondary | ICD-10-CM | POA: Insufficient documentation

## 2015-05-02 DIAGNOSIS — Z7982 Long term (current) use of aspirin: Secondary | ICD-10-CM | POA: Diagnosis not present

## 2015-05-02 DIAGNOSIS — Z8673 Personal history of transient ischemic attack (TIA), and cerebral infarction without residual deficits: Secondary | ICD-10-CM | POA: Insufficient documentation

## 2015-05-02 DIAGNOSIS — I639 Cerebral infarction, unspecified: Secondary | ICD-10-CM | POA: Diagnosis not present

## 2015-05-02 DIAGNOSIS — Z79899 Other long term (current) drug therapy: Secondary | ICD-10-CM | POA: Insufficient documentation

## 2015-05-02 DIAGNOSIS — R030 Elevated blood-pressure reading, without diagnosis of hypertension: Secondary | ICD-10-CM | POA: Insufficient documentation

## 2015-05-02 HISTORY — DX: Hyperlipidemia, unspecified: E78.5

## 2015-05-02 NOTE — Progress Notes (Signed)
Patient here for 3 month F/U. Patient denies any pain today. Patient has taken his medications today and does not need any refills.

## 2015-05-02 NOTE — Patient Instructions (Signed)
Dyslipidemia Dyslipidemia is an imbalance of the lipids in your blood. Lipids are waxy, fat-like proteins that your body needs in small amounts. Dyslipidemia often involves the lipids cholesterol or triglycerides. Common forms of dyslipidemia are:  High levels of bad cholesterol (LDL cholesterol). LDL cholesterol is the type of cholesterol that causes heart disease.  Low levels of good cholesterol (HDL cholesterol). HDL cholesterol is the type of cholesterol that helps protect against heart disease.  High levels of triglycerides. Triglycerides are a fatty substance in the blood linked to a buildup of plaque on your arteries. RISK FACTORS  Increased age.  Having a family history of high cholesterol.  Certain medicines, including birth control pills, diuretics, beta-blockers, and some medicines for depression.  Smoking.  Eating a high-fat diet.  Being overweight.  Medical conditions such as diabetes, polycystic ovary syndrome, pregnancy, kidney disease, and hypothyroidism.  Lack of regular exercise. SIGNS AND SYMPTOMS There are no signs or symptoms with dyslipidemia.  DIAGNOSIS  A simple blood test called a fasting blood test can be done to determine your level of:  Total cholesterol. This is the combined number of LDL cholesterol and HDL cholesterol. A healthy number is lower than 200.  LDL cholesterol. The goal number for LDL cholesterol is different for each person depending on risk factors. Ask your health care provider what your LDL cholesterol number should be.  HDL cholesterol. A healthy level of HDL cholesterol is 60 or higher. A number lower than 40 for men or 50 for women is a danger sign.  Triglycerides. A healthy triglyceride number is less than 150. TREATMENT  Dyslipidemia is a treatable condition. Your health care provider will advise you on what type of treatment is best based on your age, your test results, and current guidelines. Treatment may include:   Dietary  changes. A dietitian can help you create a meal plan. You may need to:  Eat more foods that contain omega-3s, such as salmon and other fish.  Replace saturated fats and trans fats in your diet with healthy fats such as nuts, seeds, avocados, olive oil, and canola oil.  Regular exercise. This can help lower your LDL cholesterol, raise your HDL cholesterol, and help with weight management. Check with your health care provider before beginning an exercise program. Most people should participate in 30 minutes of brisk exercise 5 days a week.  Quitting smoking.  Medicines to lower LDL cholesterol and triglycerides. Your health care provider will monitor your lipid levels with regular blood tests. HOME CARE INSTRUCTIONS  Eat a healthy diet. Follow any diet instructions if they were given to you by your health care provider.  Maintain a healthy weight.  Exercise regularly based on the recommendations of your health care provider.  Do not use any tobacco products, including cigarettes, chewing tobacco, or electronic cigarettes.  Take medicines only as directed by your health care provider.  Keep all follow-up visits as directed by your health care provider. SEEK MEDICAL CARE IF: You are having possible side effects from your medicines. Document Released: 09/08/2013 Document Revised: 01/18/2014 Document Reviewed: 09/08/2013 [facility] Patient Information 2015 [place], [place]. This information is not intended to replace advice given to you by your health care provider. Make sure you discuss any questions you have with your health care provider. DASH Eating Plan DASH stands for "Dietary Approaches to Stop Hypertension." The DASH eating plan is a healthy eating plan that has been shown to reduce high blood pressure (hypertension). Additional health benefits may include reducing  the risk of type 2 diabetes mellitus, heart disease, and stroke. The DASH eating plan may also help with weight loss. WHAT  DO I NEED TO KNOW ABOUT THE DASH EATING PLAN? For the DASH eating plan, you will follow these general guidelines:  Choose foods with a percent daily value for sodium of less than 5% (as listed on the food label).  Use salt-free seasonings or herbs instead of table salt or sea salt.  Check with your health care provider or pharmacist before using salt substitutes.  Eat lower-sodium products, often labeled as "lower sodium" or "no salt added."  Eat fresh foods.  Eat more vegetables, fruits, and low-fat dairy products.  Choose whole grains. Look for the word "whole" as the first word in the ingredient list.  Choose fish and skinless chicken or [place] more often than red meat. Limit fish, poultry, and meat to 6 oz (170 g) each day.  Limit sweets, desserts, sugars, and sugary drinks.  Choose heart-healthy fats.  Limit cheese to 1 oz (28 g) per day.  Eat more home-cooked food and less restaurant, buffet, and fast food.  Limit fried foods.  Cook foods using methods other than frying.  Limit canned vegetables. If you do use them, rinse them well to decrease the sodium.  When eating at a restaurant, ask that your food be prepared with less salt, or no salt if possible. WHAT FOODS CAN I EAT? Seek help from a dietitian for individual calorie needs. Grains Whole grain or whole wheat bread. Brown rice. Whole grain or whole wheat pasta. Quinoa, bulgur, and whole grain cereals. Low-sodium cereals. Corn or whole wheat flour tortillas. Whole grain cornbread. Whole grain crackers. Low-sodium crackers. Vegetables Fresh or frozen vegetables (raw, steamed, roasted, or grilled). Low-sodium or reduced-sodium tomato and vegetable juices. Low-sodium or reduced-sodium tomato sauce and paste. Low-sodium or reduced-sodium canned vegetables.  Fruits All fresh, canned (in natural juice), or frozen fruits. Meat and Other Protein Products Ground beef (85% or leaner), grass-fed beef, or beef trimmed of  fat. Skinless chicken or [place]. Ground chicken or [place]. Pork trimmed of fat. All fish and seafood. Eggs. Dried beans, peas, or lentils. Unsalted nuts and seeds. Unsalted canned beans. Dairy Low-fat dairy products, such as skim or 1% milk, 2% or reduced-fat cheeses, low-fat ricotta or cottage cheese, or plain low-fat yogurt. Low-sodium or reduced-sodium cheeses. Fats and Oils Tub margarines without trans fats. Light or reduced-fat mayonnaise and salad dressings (reduced sodium). Avocado. Safflower, olive, or canola oils. Natural peanut or almond butter. Other Unsalted popcorn and pretzels. The items listed above may not be a complete list of recommended foods or beverages. Contact your dietitian for more options. WHAT FOODS ARE NOT RECOMMENDED? Grains White bread. White pasta. White rice. Refined cornbread. Bagels and croissants. Crackers that contain trans fat. Vegetables Creamed or fried vegetables. Vegetables in a cheese sauce. Regular canned vegetables. Regular canned tomato sauce and paste. Regular tomato and vegetable juices. Fruits Dried fruits. Canned fruit in light or heavy syrup. Fruit juice. Meat and Other Protein Products Fatty cuts of meat. Ribs, chicken wings, bacon, sausage, bologna, salami, chitterlings, fatback, hot dogs, bratwurst, and packaged luncheon meats. Salted nuts and seeds. Canned beans with salt. Dairy Whole or 2% milk, cream, half-and-half, and cream cheese. Whole-fat or sweetened yogurt. Full-fat cheeses or blue cheese. Nondairy creamers and whipped toppings. Processed cheese, cheese spreads, or cheese curds. Condiments Onion and garlic salt, seasoned salt, table salt, and sea salt. Canned and packaged gravies. Worcestershire sauce. Tartar  sauce. Barbecue sauce. Teriyaki sauce. Soy sauce, including reduced sodium. Steak sauce. Fish sauce. Oyster sauce. Cocktail sauce. Horseradish. Ketchup and mustard. Meat flavorings and tenderizers. Bouillon cubes. Hot sauce.  Tabasco sauce. Marinades. Taco seasonings. Relishes. Fats and Oils Butter, stick margarine, lard, shortening, ghee, and bacon fat. Coconut, palm kernel, or palm oils. Regular salad dressings. Other Pickles and olives. Salted popcorn and pretzels. The items listed above may not be a complete list of foods and beverages to avoid. Contact your dietitian for more information. WHERE CAN I FIND MORE INFORMATION? National Heart, Lung, and Blood Institute: [employer] Document Released: 08/23/2011 Document Revised: 01/18/2014 Document Reviewed: 07/08/2013 [facility] Patient Information 2015 [place], [place]. This information is not intended to replace advice given to you by your health care provider. Make sure you discuss any questions you have with your health care provider.

## 2015-05-02 NOTE — Progress Notes (Signed)
Patient ID: Justin Odom, male   DOB: 07/10/1956, 59 y.o.   MRN: [id number]   Justin Odom, is a 59 y.o. male  [id number]  [id number]  DOB - [birth year]  Chief Complaint  Patient presents with  . Follow-up        Subjective:   Justin Odom is a 59 y.o. male here today for a follow up visit. 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 He is here today for routine follow-up. He has been to rehabilitation and was gradually doing better with the hemiparalysis. His left-sided weakness and facial droop is much improved. He would like to go back to full-time job in September, he started part-time in June after 3 months off of duty. He has no new complaint today. He does not need a refill on his medications. Patient has No headache, No chest pain, No abdominal pain - No Nausea, No new weakness tingling or numbness, No Cough - SOB.  Problem  Dyslipidemia    ALLERGIES: No Known Allergies  PAST MEDICAL HISTORY: Past Medical History  Diagnosis Date  . CVA (cerebral infarction)   . Hyperlipidemia     MEDICATIONS AT HOME: Prior to Admission medications   Medication Sig Start Date End Date Taking? Authorizing Provider  aspirin EC 325 MG tablet Take 1 tablet (325 mg total) by mouth daily. 11/29/14  Yes Quentin Angst, MD  atorvastatin (LIPITOR) 40 MG tablet Take 1 tablet (40 mg total) by mouth daily. 11/29/14  Yes Quentin Angst, MD  Multiple Vitamins-Minerals (MULTIVITAMIN WITH MINERALS) tablet Take 1 tablet by mouth daily.   Yes Historical Provider, MD  neomycin-polymyxin-hydrocortisone (CORTISPORIN) otic solution Place 4 drops into the right ear 3 (three) times daily. Patient not taking: Reported on 02/10/2015 01/20/15   Shade Flood, MD  valACYclovir (VALTREX) 1000 MG tablet Take 1 tablet (1,000 mg total) by mouth 2 (two) times daily. Patient not taking: Reported on 11/29/2014 10/04/14   Jonita Albee, MD      Objective:   Filed Vitals:   05/02/15 1054  BP: 132/83  Pulse: 65  Temp: 98.8 F (37.1 C)  TempSrc: Oral  Resp: 16  Height:  (1.803 m)  Weight: 153 lb (69.4 kg)  SpO2: 98%    Exam General appearance : Awake, alert, not in any distress. Speech Clear. Not toxic looking HEENT: Atraumatic and Normocephalic, pupils equally reactive to light and accomodation Neck: supple, no JVD. No cervical lymphadenopathy.  Chest:Good air entry bilaterally, no added sounds  CVS: S1 S2 regular, no murmurs.  Abdomen: Bowel sounds present, Non tender and not distended with no gaurding, rigidity or rebound. Extremities: B/L Lower Ext shows no edema, both legs are warm to touch Neurology: Awake alert, and oriented X 3, CN II-XII intact, Non focal Skin:No Rash  Data Review Lab Results  Component Value Date   HGBA1C 5.10 11/29/2014     Assessment & Plan   1. Dyslipidemia  To address this please limit saturated fat to no more than 7% of your calories, limit cholesterol to 200 mg/day, increase fiber and exercise as tolerated. If needed we may add another cholesterol lowering medication to your regimen.   2. Borderline high blood pressure  - We have discussed target BP range and blood pressure goal - I have advised patient to check BP regularly and to call us back or report to clinic if the numbers are consistently higher than 140/90  - We discussed the  importance of compliance with medical therapy and DASH diet recommended, consequences of uncontrolled hypertension discussed.  - continue current BP medications  3. CVA (cerebral vascular accident) Continue aspirin and regular physical exercise.  Patient have been counseled extensively about nutrition and exercise Return in about 6 months (around 11/02/2015), or if symptoms worsen or fail to improve, for Follow up HTN, Annual Physical.  The patient was given clear instructions to go to ER or return to medical center if symptoms don't  improve, worsen or new problems develop. The patient verbalized understanding. The patient was told to call to get lab results if they haven't heard anything in the next week.   This note has been created with Education officer, environmental. Any transcriptional errors are unintentional.    Jeanann Lewandowsky, MD, MHA, CPE, FACP, FAAP [facility] [place], [place] [phone]   05/02/2015, 11:03 AM

## 2015-05-06 ENCOUNTER — Telehealth: Payer: Self-pay | Admitting: *Deleted

## 2015-05-06 NOTE — Telephone Encounter (Signed)
Patient called in July asking if it would be ok to take Bufferin brand aspirin instead of plain aspirin.  I explained to him that Bufferin was aspirin with added ingredients that can help combat any stomach upset that the aspirin may cause.  I told him as long as the Bufferin had 325 mg of aspirin listed in the ingredients that it would be fine for him to take. Patient verbalized understanding.

## 2015-10-04 ENCOUNTER — Ambulatory Visit (INDEPENDENT_AMBULATORY_CARE_PROVIDER_SITE_OTHER): Payer: BLUE CROSS/BLUE SHIELD | Admitting: Family Medicine

## 2015-10-04 VITALS — BP 128/78 | HR 77 | Temp 98.6°F | Resp 16 | Ht 71.0 in | Wt 154.8 lb

## 2015-10-04 DIAGNOSIS — J029 Acute pharyngitis, unspecified: Secondary | ICD-10-CM | POA: Diagnosis not present

## 2015-10-04 DIAGNOSIS — H209 Unspecified iridocyclitis: Secondary | ICD-10-CM | POA: Diagnosis not present

## 2015-10-04 LAB — POCT RAPID STREP A (OFFICE): RAPID STREP A SCREEN: NEGATIVE

## 2015-10-04 MED ORDER — EPINEPHRINE 0.3 MG/0.3ML IJ SOAJ: 0.3000 mg | Freq: Once | INTRAMUSCULAR | Status: DC

## 2015-10-04 MED ORDER — PREDNISONE 20 MG PO TABS
ORAL_TABLET | ORAL | Status: DC
Start: 2015-10-04 — End: 2015-10-24

## 2015-10-04 NOTE — Patient Instructions (Addendum)
Your uvula (the thing that you can see hanging down at the back of your throat) is swollen and inflamed.  This is likely due to a viral illness.  I do not think that this will cause you any serious harm,  be aware that if you have any difficulty breathing or swallowing you need to seek immediate help.  If you have swelling that interferes with your breathing use your epinephrine injector and call 911.  Otherwise, use the prednisone as directed and use benadryl for allergies according to the package directions Remember that benadryl can make you feel sleepy so you should not use it if you are driving  Let me know if you are not feeling better in the next few days - Sooner if worse.   [facility] now carries a less expensive epinephrine injector which may be the best choice for you- this is like the epipen but is cheaper.  Please obtain one of these devices and keep on hand in case of emergency

## 2015-10-04 NOTE — Progress Notes (Addendum)
[facility] [address] [place] [address] [phone]  Date:  10/04/2015   Name:  Justin Odom   DOB:  Jan 08, 1956   MRN:  [id number]  PCP:  Jeanann Lewandowsky, MD    Chief Complaint: Sore Throat   History of Present Illness:  Justin Odom is a 60 y.o. very pleasant male patient who presents with the following:  Here today with illness.  He also suffered a CVA just about a year ago- he is more of less fully recovered Today is Tuesday. Sunday am he noted a right sided ST- it hurt to swallow He has tried tea with honey and lemon, cough drops He has not noted a fever No cough, runny nose or sneezing. No itching, lip or tongue swelling, SOB or hives  No sick contacts No GI symptoms He has felt tired.   He just moved houses yesterday- slept a long time last night after he finished the move  He is nearly recovered from his stroke last year.  He quit smoking No mechanical issues with swallowing at baseline or now  Patient Active Problem List   Diagnosis Date Noted  . Dyslipidemia 05/02/2015  . Otalgia 02/10/2015  . Borderline high blood pressure 01/10/2015  . CVA (cerebral vascular accident) (HCC) 11/29/2014  . Acute left hemiparesis (HCC) 11/29/2014    Past Medical History  Diagnosis Date  . CVA (cerebral infarction)   . Hyperlipidemia     History reviewed. No pertinent past surgical history.  Social History  Substance Use Topics  . Smoking status: Former Smoker -- 1.00 packs/day    Types: Cigarettes    Quit date: 12/21/2014  . Smokeless tobacco: None     Comment: states he quit last week   . Alcohol Use: 0.0 oz/week    0 Standard drinks or equivalent per week     Comment: occ    Family History  Problem Relation Age of Onset  . Cancer Mother   . Cancer Sister     No Known Allergies  Medication list has been reviewed and updated.  Current Outpatient Prescriptions on File Prior to Visit  Medication Sig Dispense Refill  .  aspirin EC 325 MG tablet Take 1 tablet (325 mg total) by mouth daily. 90 tablet 3  . atorvastatin (LIPITOR) 40 MG tablet Take 1 tablet (40 mg total) by mouth daily. 90 tablet 3  . Multiple Vitamins-Minerals (MULTIVITAMIN WITH MINERALS) tablet Take 1 tablet by mouth daily. Reported on 10/04/2015    . neomycin-polymyxin-hydrocortisone (CORTISPORIN) otic solution Place 4 drops into the right ear 3 (three) times daily. (Patient not taking: Reported on 02/10/2015) 10 mL 0  . valACYclovir (VALTREX) 1000 MG tablet Take 1 tablet (1,000 mg total) by mouth 2 (two) times daily. (Patient not taking: Reported on 11/29/2014) 20 tablet 1   No current facility-administered medications on file prior to visit.    Review of Systems:  As per HPI- otherwise negative.   Physical Examination: Filed Vitals:   10/04/15 0856  BP: 128/78  Pulse: 77  Temp: 98.6 F (37 C)  Resp: 16   Filed Vitals:   10/04/15 0856  Height:  (1.803 m)  Weight: 154 lb 12.8 oz (70.217 kg)   Body mass index is 21.6 kg/(m^2). Ideal Body Weight: Weight in (lb) to have BMI = 25: 178.9  GEN: WDWN, NAD, Non-toxic, A & O x 3, looks well, slim build HEENT: Atraumatic, Normocephalic. Neck supple. No masses, No LAD.  Bilateral  TM wnl, oropharynx inflamed without exudate.  PEERL,EOMI.   Uvula is slighlty edematous- it does not collude the airway in any way however Ears and Nose: No external deformity. CV: RRR, No M/G/R. No JVD. No thrill. No extra heart sounds. PULM: CTA B, no wheezes, crackles, rhonchi. No retractions. No resp. distress. No accessory muscle use. ABD: S, NT, ND EXTR: No c/c/e NEURO Normal gait.  PSYCH: Normally interactive. Conversant. Not depressed or anxious appearing.  Calm demeanor. Results for orders placed or performed in visit on 10/04/15  POCT rapid strep A  Result Value Ref Range   Rapid Strep A Screen Negative Negative    Assessment and Plan: Acute pharyngitis, unspecified etiology - Plan: POCT  rapid strep A, Culture, Group A Strep, EPINEPHrine 0.3 mg/0.3 mL IJ SOAJ injection  Uveitis - Plan: predniSONE (DELTASONE) 20 MG tablet here today with pharyngitis/ uveitis.  At this time do not see any evidence of bacterial infection- this is likely viral in nature Will treat with po prednisone to decrease swelling of the uvula and help prevent any worsening Also recommend po benadryl epipen to have on hand in case of airway compromise-See patient instructions for more details.    Signed Abbe Amsterdam, MD  1/18- received positive strep- not group A- culture  Results for orders placed or performed in visit on 10/04/15  Culture, Group A Strep  Result Value Ref Range   Organism ID, Bacteria Abundant STREPTOCOCCUS BETA HEMOLYTIC NOT GROUP A   POCT rapid strep A  Result Value Ref Range   Rapid Strep A Screen Negative Negative   Called him and will send in an rx for penicillin as he does have strep after all Meds ordered this encounter  Medications  . EPINEPHrine 0.3 mg/0.3 mL IJ SOAJ injection    Sig: Inject 0.3 mLs (0.3 mg total) into the muscle once. Ok to substitute equivalent medication ([facility])    Dispense:  1 Device    Refill:  1  . predniSONE (DELTASONE) 20 MG tablet    Sig: Take 3 pills for 2 days, 2 pills for 2 days, then 1 pill for 2 days    Dispense:  12 tablet    Refill:  0  . penicillin v potassium (VEETID) 500 MG tablet    Sig: Take 1 tablet (500 mg total) by mouth 2 (two) times daily.    Dispense:  20 tablet    Refill:  0

## 2015-10-05 ENCOUNTER — Encounter: Payer: Self-pay | Admitting: Family Medicine

## 2015-10-05 ENCOUNTER — Other Ambulatory Visit: Payer: Self-pay | Admitting: Family Medicine

## 2015-10-05 DIAGNOSIS — J029 Acute pharyngitis, unspecified: Secondary | ICD-10-CM

## 2015-10-05 LAB — CULTURE, GROUP A STREP

## 2015-10-05 MED ORDER — PENICILLIN V POTASSIUM 500 MG PO TABS: 500.0000 mg | ORAL_TABLET | Freq: Two times a day (BID) | ORAL | Status: DC

## 2015-10-05 NOTE — Addendum Note (Signed)
Addended by: Abbe Amsterdam C on: 10/05/2015 05:14 PM   Modules accepted: Orders

## 2015-10-10 ENCOUNTER — Telehealth: Payer: Self-pay | Admitting: Internal Medicine

## 2015-10-10 NOTE — Telephone Encounter (Signed)
Pt. Called requesting a med refill on atorvastatin (LIPITOR) 40 MG tablet. Pt. Stated he would want [facility] [address]. Please f/u

## 2015-10-11 ENCOUNTER — Other Ambulatory Visit: Payer: Self-pay | Admitting: Internal Medicine

## 2015-10-11 ENCOUNTER — Other Ambulatory Visit: Payer: Self-pay

## 2015-10-11 DIAGNOSIS — E785 Hyperlipidemia, unspecified: Secondary | ICD-10-CM

## 2015-10-11 MED ORDER — ATORVASTATIN CALCIUM 40 MG PO TABS: 40.0000 mg | ORAL_TABLET | Freq: Every day | ORAL | Status: DC

## 2015-10-11 NOTE — Telephone Encounter (Signed)
Pt. Called requesting a med refill on atorvastatin (LIPITOR) 40 MG tablet. Pt. Stated he would want the [facility] [address]. Please f/u °

## 2015-10-11 NOTE — Telephone Encounter (Signed)
[facility] [address].  Front office staff aware of patient needing apportionment to be seen prior to additional refills.

## 2015-10-17 ENCOUNTER — Telehealth: Payer: Self-pay | Admitting: *Deleted

## 2015-10-17 NOTE — Telephone Encounter (Signed)
Patient verified DOB Patient stated he has an appointment on Monday with Dr. Hyman Hopes and he will seriously consider receiving the flu shot. Patient also stated [facility] Patient had no further questions.

## 2015-10-24 ENCOUNTER — Encounter: Payer: Self-pay | Admitting: Internal Medicine

## 2015-10-24 ENCOUNTER — Ambulatory Visit: Payer: BLUE CROSS/BLUE SHIELD | Attending: Internal Medicine | Admitting: Internal Medicine

## 2015-10-24 VITALS — BP 133/85 | HR 68 | Temp 98.5°F | Resp 18 | Ht 71.0 in | Wt 159.0 lb

## 2015-10-24 DIAGNOSIS — Z0000 Encounter for general adult medical examination without abnormal findings: Secondary | ICD-10-CM | POA: Insufficient documentation

## 2015-10-24 DIAGNOSIS — Z1211 Encounter for screening for malignant neoplasm of colon: Secondary | ICD-10-CM

## 2015-10-24 DIAGNOSIS — Z8673 Personal history of transient ischemic attack (TIA), and cerebral infarction without residual deficits: Secondary | ICD-10-CM | POA: Insufficient documentation

## 2015-10-24 DIAGNOSIS — F17200 Nicotine dependence, unspecified, uncomplicated: Secondary | ICD-10-CM

## 2015-10-24 DIAGNOSIS — F17290 Nicotine dependence, other tobacco product, uncomplicated: Secondary | ICD-10-CM

## 2015-10-24 DIAGNOSIS — R030 Elevated blood-pressure reading, without diagnosis of hypertension: Secondary | ICD-10-CM

## 2015-10-24 DIAGNOSIS — F17210 Nicotine dependence, cigarettes, uncomplicated: Secondary | ICD-10-CM | POA: Diagnosis not present

## 2015-10-24 DIAGNOSIS — E785 Hyperlipidemia, unspecified: Secondary | ICD-10-CM | POA: Insufficient documentation

## 2015-10-24 DIAGNOSIS — Z7982 Long term (current) use of aspirin: Secondary | ICD-10-CM | POA: Insufficient documentation

## 2015-10-24 HISTORY — DX: Encounter for screening for malignant neoplasm of colon: Z12.11

## 2015-10-24 HISTORY — DX: Nicotine dependence, other tobacco product, uncomplicated: F17.290

## 2015-10-24 LAB — COMPLETE METABOLIC PANEL WITH GFR
ALBUMIN: 3.9 g/dL (ref 3.6–5.1)
ALT: 10 U/L (ref 9–46)
AST: 18 U/L (ref 10–35)
Alkaline Phosphatase: 59 U/L (ref 40–115)
BILIRUBIN TOTAL: 0.4 mg/dL (ref 0.2–1.2)
BUN: 14 mg/dL (ref 7–25)
CO2: 27 mmol/L (ref 20–31)
Calcium: 9.2 mg/dL (ref 8.6–10.3)
Chloride: 106 mmol/L (ref 98–110)
Creat: 0.86 mg/dL (ref 0.70–1.33)
GFR, Est African American: >89 mL/min (ref >=60)
GLUCOSE: 88 mg/dL (ref 65–99)
Potassium: 4.9 mmol/L (ref 3.5–5.3)
SODIUM: 140 mmol/L (ref 135–146)
TOTAL PROTEIN: 7 g/dL (ref 6.1–8.1)

## 2015-10-24 LAB — LIPID PANEL
Cholesterol: 125 mg/dL (ref 125–200)
HDL: 41 mg/dL (ref >=40)
LDL CALC: 76 mg/dL (ref <130)
Total CHOL/HDL Ratio: 3 Ratio (ref <=5.0)
Triglycerides: 38 mg/dL (ref <150)
VLDL: 8 mg/dL (ref <30)

## 2015-10-24 MED ORDER — ATORVASTATIN CALCIUM 40 MG PO TABS: 40.0000 mg | ORAL_TABLET | Freq: Every day | ORAL | Status: DC

## 2015-10-24 NOTE — Progress Notes (Deleted)
Justin Odom, is a 60 y.o. male  ZOX:[id number]  WJX:[id number]  DOB - October 14, 1955  CC: No chief complaint on file.   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 He is here today for routine follow-up. He has been to rehabilitation and was gradually doing better with the hemiparalysis. His left-sided weakness and facial droop is much improved. He would like to go back to full-time job in September, he started part-time in June after 3 months off of duty. He has no new complaint today. He does not need a refill on his medications. Patient has No headache, No chest pain, No abdominal pain - No Nausea, No new weakness tingling or numbness, No Cough - SOB.   HPI: Justin Odom is a 60 y.o. male here today for routine follow up visit. Patient has history of lacuna infarct and dyslipidemia. Patient states some left-sided weakness but much improved. Patient was recently seen 10/04/15 in Urgent care and diagnosed with Strept Throat diagnosed Penicillin.  Patient has No headache, No chest pain, No abdominal pain - No Nausea, No new weakness tingling or numbness, No Cough - SOB.  No Known Allergies Past Medical History  Diagnosis Date  . CVA (cerebral infarction)   . Hyperlipidemia    Current Outpatient Prescriptions on File Prior to Visit  Medication Sig Dispense Refill  . aspirin EC 325 MG tablet Take 1 tablet (325 mg total) by mouth daily. 90 tablet 3  . atorvastatin (LIPITOR) 40 MG tablet Take 1 tablet (40 mg total) by mouth daily. 30 tablet 0  . Multiple Vitamins-Minerals (MULTIVITAMIN WITH MINERALS) tablet Take 1 tablet by mouth daily. Reported on 10/04/2015    . penicillin v potassium (VEETID) 500 MG tablet Take 1 tablet (500 mg total) by mouth 2 (two) times daily. 20 tablet 0   No current facility-administered medications on file prior to visit.   Family History  Problem Relation Age of Onset  . Cancer Mother   . Cancer Sister      Social History   Social History  . Marital Status: Divorced    Spouse Name: N/A  . Number of Children: N/A  . Years of Education: N/A   Occupational History  . Not on file.   Social History Main Topics  . Smoking status: Current Every Day Smoker -- 1.00 packs/day    Types: Cigarettes    Last Attempt to Quit: 12/21/2014  . Smokeless tobacco: Not on file     Comment: 3 cigarettes a day  . Alcohol Use: 0.0 oz/week    0 Standard drinks or equivalent per week     Comment: occ  . Drug Use: Yes    Special: Marijuana  . Sexual Activity: Not on file   Other Topics Concern  . Not on file   Social History Narrative    ROS  Objective:   Filed Vitals:   10/24/15 1044  BP: 152/88  Pulse: 71  Resp: 18    Physical Exam: Constitutional: Patient appears well-developed and well-nourished. No distress. HENT: Normocephalic, atraumatic, External right and left ear normal. Oropharynx is clear and moist.  Eyes: Conjunctivae and EOM are normal. PERRLA, no scleral icterus. Neck: Normal ROM. Neck supple. No JVD. No tracheal deviation. No thyromegaly. CVS: RRR, S1/S2 +, no murmurs, no gallops, no carotid bruit.  Pulmonary: Effort and breath sounds normal, no stridor, rhonchi, wheezes, rales.  Abdominal: Soft. BS +, no distension, tenderness, rebound or guarding.  Musculoskeletal: Normal  range of motion. No edema and no tenderness.  Lymphadenopathy: No lymphadenopathy noted. Neuro: Alert. Normal reflexes, muscle tone coordination. No cranial nerve deficit. Skin: Skin is warm and dry. No rash noted. Not diaphoretic. No erythema. No pallor. Psychiatric: Normal mood and affect. Behavior, judgment, thought content normal.  Lab Results  Component Value Date   WBC 5.9 11/29/2014   HGB 13.8 11/29/2014   HCT 42.0 11/29/2014   MCV 91.7 11/29/2014   PLT 286 11/29/2014   Lab Results  Component Value Date   CREATININE 0.97 11/29/2014   BUN 10 11/29/2014   NA 139 11/29/2014   K 4.8  11/29/2014   CL 103 11/29/2014   CO2 27 11/29/2014    Lab Results  Component Value Date   HGBA1C 5.10 11/29/2014   Lipid Panel     Component Value Date/Time   CHOL 200 11/29/2014 1010   TRIG 90 11/29/2014 1010   HDL 31* 11/29/2014 1010   CHOLHDL 6.5 11/29/2014 1010   VLDL 18 11/29/2014 1010   LDLCALC 151* 11/29/2014 1010       Assessment and plan:   There are no diagnoses linked to this encounter.  No Follow-up on file.  The patient was given clear instructions to go to ER or return to medical center if symptoms don't improve, worsen or new problems develop. The patient verbalized understanding. The patient was told to call to get lab results if they haven't heard anything in the next week.     Stephanie Coup, NP-C [facility] [phone] 10/24/2015, 10:57 AM

## 2015-10-24 NOTE — Patient Instructions (Signed)
DASH Eating Plan °DASH stands for "Dietary Approaches to Stop Hypertension." The DASH eating plan is a healthy eating plan that has been shown to reduce high blood pressure (hypertension). Additional health benefits may include reducing the risk of type 2 diabetes mellitus, heart disease, and stroke. The DASH eating plan may also help with weight loss. °WHAT DO I NEED TO KNOW ABOUT THE DASH EATING PLAN? °For the DASH eating plan, you will follow these general guidelines: °· Choose foods with a percent daily value for sodium of less than 5% (as listed on the food label). °· Use salt-free seasonings or herbs instead of table salt or sea salt. °· Check with your health care provider or pharmacist before using salt substitutes. °· Eat lower-sodium products, often labeled as "lower sodium" or "no salt added." °· Eat fresh foods. °· Eat more vegetables, fruits, and low-fat dairy products. °· Choose whole grains. Look for the word "whole" as the first word in the ingredient list. °· Choose fish and skinless chicken or turkey more often than red meat. Limit fish, poultry, and meat to 6 oz (170 g) each day. °· Limit sweets, desserts, sugars, and sugary drinks. °· Choose heart-healthy fats. °· Limit cheese to 1 oz (28 g) per day. °· Eat more home-cooked food and less restaurant, buffet, and fast food. °· Limit fried foods. °· Cook foods using methods other than frying. °· Limit canned vegetables. If you do use them, rinse them well to decrease the sodium. °· When eating at a restaurant, ask that your food be prepared with less salt, or no salt if possible. °WHAT FOODS CAN I EAT? °Seek help from a dietitian for individual calorie needs. °Grains °Whole grain or whole wheat bread. Brown rice. Whole grain or whole wheat pasta. Quinoa, bulgur, and whole grain cereals. Low-sodium cereals. Corn or whole wheat flour tortillas. Whole grain cornbread. Whole grain crackers. Low-sodium crackers. °Vegetables °Fresh or frozen vegetables  (raw, steamed, roasted, or grilled). Low-sodium or reduced-sodium tomato and vegetable juices. Low-sodium or reduced-sodium tomato sauce and paste. Low-sodium or reduced-sodium canned vegetables.  °Fruits °All fresh, canned (in natural juice), or frozen fruits. °Meat and Other Protein Products °Ground beef (85% or leaner), grass-fed beef, or beef trimmed of fat. Skinless chicken or turkey. Ground chicken or turkey. Pork trimmed of fat. All fish and seafood. Eggs. Dried beans, peas, or lentils. Unsalted nuts and seeds. Unsalted canned beans. °Dairy °Low-fat dairy products, such as skim or 1% milk, 2% or reduced-fat cheeses, low-fat ricotta or cottage cheese, or plain low-fat yogurt. Low-sodium or reduced-sodium cheeses. °Fats and Oils °Tub margarines without trans fats. Light or reduced-fat mayonnaise and salad dressings (reduced sodium). Avocado. Safflower, olive, or canola oils. Natural peanut or almond butter. °Other °Unsalted popcorn and pretzels. °The items listed above may not be a complete list of recommended foods or beverages. Contact your dietitian for more options. °WHAT FOODS ARE NOT RECOMMENDED? °Grains °White bread. White pasta. White rice. Refined cornbread. Bagels and croissants. Crackers that contain trans fat. °Vegetables °Creamed or fried vegetables. Vegetables in a cheese sauce. Regular canned vegetables. Regular canned tomato sauce and paste. Regular tomato and vegetable juices. °Fruits °Dried fruits. Canned fruit in light or heavy syrup. Fruit juice. °Meat and Other Protein Products °Fatty cuts of meat. Ribs, chicken wings, bacon, sausage, bologna, salami, chitterlings, fatback, hot dogs, bratwurst, and packaged luncheon meats. Salted nuts and seeds. Canned beans with salt. °Dairy °Whole or 2% milk, cream, half-and-half, and cream cheese. Whole-fat or sweetened yogurt. Full-fat   cheeses or blue cheese. Nondairy creamers and whipped toppings. Processed cheese, cheese spreads, or cheese  curds. °Condiments °Onion and garlic salt, seasoned salt, table salt, and sea salt. Canned and packaged gravies. Worcestershire sauce. Tartar sauce. Barbecue sauce. Teriyaki sauce. Soy sauce, including reduced sodium. Steak sauce. Fish sauce. Oyster sauce. Cocktail sauce. Horseradish. Ketchup and mustard. Meat flavorings and tenderizers. Bouillon cubes. Hot sauce. Tabasco sauce. Marinades. Taco seasonings. Relishes. °Fats and Oils °Butter, stick margarine, lard, shortening, ghee, and bacon fat. Coconut, palm kernel, or palm oils. Regular salad dressings. °Other °Pickles and olives. Salted popcorn and pretzels. °The items listed above may not be a complete list of foods and beverages to avoid. Contact your dietitian for more information. °WHERE CAN I FIND MORE INFORMATION? °National Heart, Lung, [employer] Institute: www.nhlbi.nih.gov/health/health-topics/topics/dash/ °  °This information is not intended to replace advice given to you by your health care provider. Make sure you discuss any questions you have with your health care provider. °  °Document Released: 08/23/2011 Document Revised: 09/24/2014 Document Reviewed: 07/08/2013 °Elsevier Interactive Patient Education ©2016 Elsevier Inc. ° °Hypertension °Hypertension, commonly called high blood pressure, is when the force of blood pumping through your arteries is too strong. Your arteries are the blood vessels that carry blood from your heart throughout your body. A blood pressure reading consists of a higher number over a lower number, such as 110/72. The higher number (systolic) is the pressure inside your arteries when your heart pumps. The lower number (diastolic) is the pressure inside your arteries when your heart relaxes. Ideally you want your blood pressure below 120/80. °Hypertension forces your heart to work harder to pump blood. Your arteries may become narrow or stiff. Having untreated or uncontrolled hypertension can cause heart attack, stroke, kidney  disease, and other problems. °RISK FACTORS °Some risk factors for high blood pressure are controllable. Others are not.  °Risk factors you cannot control include:  °· Race. You may be at higher risk if you are African American. °· Age. Risk increases with age. °· Gender. Men are at higher risk than women before age 45 years. After age 65, women are at higher risk than men. °Risk factors you can control include: °· Not getting enough exercise or physical activity. °· Being overweight. °· Getting too much fat, sugar, calories, or salt in your diet. °· Drinking too much alcohol. °SIGNS AND SYMPTOMS °Hypertension does not usually cause signs or symptoms. Extremely high blood pressure (hypertensive crisis) may cause headache, anxiety, shortness of breath, and nosebleed. °DIAGNOSIS °To check if you have hypertension, your health care provider will measure your blood pressure while you are seated, with your arm held at the level of your heart. It should be measured at least twice using the same arm. Certain conditions can cause a difference in blood pressure between your right and left arms. A blood pressure reading that is higher than normal on one occasion does not mean that you need treatment. If it is not clear whether you have high blood pressure, you may be asked to return on a different day to have your blood pressure checked again. Or, you may be asked to monitor your blood pressure at home for 1 or more weeks. °TREATMENT °Treating high blood pressure includes making lifestyle changes and possibly taking medicine. Living a healthy lifestyle can help lower high blood pressure. You may need to change some of your habits. °Lifestyle changes may include: °· Following the DASH diet. This diet is high in fruits, vegetables, and whole   grains. It is low in salt, red meat, and added sugars. °· Keep your sodium intake below 2,300 mg per day. °· Getting at least 30-45 minutes of aerobic exercise at least 4 times per  week. °· Losing weight if necessary. °· Not smoking. °· Limiting alcoholic beverages. °· Learning ways to reduce stress. °Your health care provider may prescribe medicine if lifestyle changes are not enough to get your blood pressure under control, and if one of the following is true: °· You are 18-59 years of age and your systolic blood pressure is above 140. °· You are 60 years of age or older, and your systolic blood pressure is above 150. °· Your diastolic blood pressure is above 90. °· You have diabetes, and your systolic blood pressure is over 140 or your diastolic blood pressure is over 90. °· You have kidney disease and your blood pressure is above 140/90. °· You have heart disease and your blood pressure is above 140/90. °Your personal target blood pressure may vary depending on your medical conditions, your age, and other factors. °HOME CARE INSTRUCTIONS °· Have your blood pressure rechecked as directed by your health care provider.   °· Take medicines only as directed by your health care provider. Follow the directions carefully. Blood pressure medicines must be taken as prescribed. The medicine does not work as well when you skip doses. Skipping doses also puts you at risk for problems. °· Do not smoke.   °· Monitor your blood pressure at home as directed by your health care provider.  °SEEK MEDICAL CARE IF:  °· You think you are having a reaction to medicines taken. °· You have recurrent headaches or feel dizzy. °· You have swelling in your ankles. °· You have trouble with your vision. °SEEK IMMEDIATE MEDICAL CARE IF: °· You develop a severe headache or confusion. °· You have unusual weakness, numbness, or feel faint. °· You have severe chest or abdominal pain. °· You vomit repeatedly. °· You have trouble breathing. °MAKE SURE YOU:  °· Understand these instructions. °· Will watch your condition. °· Will get help right away if you are not doing well or get worse. °  °This information is not intended to  replace advice given to you by your health care provider. Make sure you discuss any questions you have with your health care provider. °  °Document Released: 09/03/2005 Document Revised: 01/18/2015 Document Reviewed: 06/26/2013 °Elsevier Interactive Patient Education ©2016 Elsevier Inc. ° °

## 2015-10-24 NOTE — Progress Notes (Signed)
Patient is here for physical.  Patient denies any pain at this time.  Patient declined the flu shot today.

## 2015-10-24 NOTE — Progress Notes (Signed)
Justin Odom, is a 60 y.o. male  ZOX:[id number]  [id number]:[id number]  DOB - 1955/12/24  CC:  Chief Complaint  Patient presents with  . Annual Exam    HPI: Justin Odom is a 60 y.o. male here today to for annual physical. Patient has history of lacuna infarct confirmed by MRI, dyslipidemia and borderline hypertension. Patient states left sided weakness has "pretty much resolved".  Patient seen recently 10/04/15 in Urgent care and diagnosed with Strept Throat and treated w/ penicillin. Patient is afebrile today, with no signs of infection. Patient has not had colonoscopy and refuses influenzae vaccine today. Patient has No headache, No chest pain, No abdominal pain - No Nausea, No new weakness tingling or numbness, No Cough - SOB.  No Known Allergies Past Medical History  Diagnosis Date  . CVA (cerebral infarction)   . Hyperlipidemia    Current Outpatient Prescriptions on File Prior to Visit  Medication Sig Dispense Refill  . aspirin EC 325 MG tablet Take 1 tablet (325 mg total) by mouth daily. 90 tablet 3  . atorvastatin (LIPITOR) 40 MG tablet Take 1 tablet (40 mg total) by mouth daily. 30 tablet 0  . Multiple Vitamins-Minerals (MULTIVITAMIN WITH MINERALS) tablet Take 1 tablet by mouth daily. Reported on 10/04/2015    . penicillin v potassium (VEETID) 500 MG tablet Take 1 tablet (500 mg total) by mouth 2 (two) times daily. 20 tablet 0   No current facility-administered medications on file prior to visit.   Family History  Problem Relation Age of Onset  . Cancer Mother   . Cancer Sister    Social History   Social History  . Marital Status: Divorced    Spouse Name: N/A  . Number of Children: N/A  . Years of Education: N/A   Occupational History  . Not on file.   Social History Main Topics  . Smoking status: Current Every Day Smoker -- 0.25 packs/day    Types: Cigarettes    Last Attempt to Quit: 12/21/2014  . Smokeless tobacco: Not on file     Comment: 3 cigarettes a day    . Alcohol Use: 0.0 oz/week    0 Standard drinks or equivalent per week     Comment: occ  . Drug Use: Yes    Special: Marijuana  . Sexual Activity: Not on file   Other Topics Concern  . Not on file   Social History Narrative    Review of Systems: Constitutional: Negative for fever, chills, diaphoresis, activity change, appetite change and fatigue. HENT: Negative for ear pain, nosebleeds, congestion, facial swelling, rhinorrhea, neck pain, neck stiffness and ear discharge.  Eyes: Negative for pain, discharge, redness, itching and visual disturbance. Respiratory: Negative for cough, choking, chest tightness, shortness of breath, wheezing and stridor.  Cardiovascular: Negative for chest pain, palpitations and leg swelling. Gastrointestinal: Negative for abdominal distention. Genitourinary: Negative for dysuria, urgency, frequency, hematuria, flank pain, decreased urine volume, difficulty urinating and dyspareunia.  Musculoskeletal: Negative for back pain, joint swelling, arthralgia and gait problem. Neurological: Negative for dizziness, tremors, seizures, syncope, facial asymmetry, speech difficulty, weakness, light-headedness, numbness and headaches.  Hematological: Negative for adenopathy. Does not bruise/bleed easily. Psychiatric/Behavioral: Negative for hallucinations, behavioral problems, confusion, dysphoric mood, decreased concentration and agitation.    Objective:   Filed Vitals:   10/24/15 1044  BP: 133/85  Pulse: 68  Temp: 98.5 F (36.9 C)  Resp: 18    Physical Exam: Constitutional: Patient appears well-developed and well-nourished. No distress. HENT: Normocephalic, atraumatic, External  right and left ear normal. Oropharynx is clear and moist.  Eyes: Conjunctivae and EOM are normal. PERRLA, no scleral icterus. Neck: Normal ROM. Neck supple. No JVD. No tracheal deviation. No thyromegaly. CVS: RRR, S1/S2 +, no murmurs, no gallops, no carotid bruit.  Pulmonary: Effort  and breath sounds normal, no stridor, rhonchi, wheezes, rales.  Abdominal: Soft. BS +, no distension, tenderness, rebound or guarding.  Musculoskeletal: Patient has normal range of motion. No edema and no tenderness. No weakness or decreased strength bilaterally.  Lymphadenopathy: No lymphadenopathy noted. Neuro: Alert. Normal reflexes, muscle tone coordination. No cranial nerve deficit. Skin: Skin is warm and dry. No rash noted. Not diaphoretic. No erythema. No pallor. Psychiatric: Normal mood and affect. Behavior, judgment, thought content normal.  Lab Results  Component Value Date   WBC 5.9 11/29/2014   HGB 13.8 11/29/2014   HCT 42.0 11/29/2014   MCV 91.7 11/29/2014   PLT 286 11/29/2014   Lab Results  Component Value Date   CREATININE 0.97 11/29/2014   BUN 10 11/29/2014   NA 139 11/29/2014   K 4.8 11/29/2014   CL 103 11/29/2014   CO2 27 11/29/2014    Lab Results  Component Value Date   HGBA1C 5.10 11/29/2014   Lipid Panel     Component Value Date/Time   CHOL 200 11/29/2014 1010   TRIG 90 11/29/2014 1010   HDL 31* 11/29/2014 1010   CHOLHDL 6.5 11/29/2014 1010   VLDL 18 11/29/2014 1010   LDLCALC 151* 11/29/2014 1010       Assessment and plan:   Justin Odom was seen today for annual exam.  Labs: Comprehensive metabolic panel with GFR, lipid panel. Ambulatory referral to gastroenterology for colonoscopy for colon cancer screening.  Diagnoses and all orders for this visit:  Dyslipidemia  To address this please limit saturated fat to no more than 7% of your calories, limit cholesterol to 200 mg/day, increase fiber and exercise as tolerated. If needed we may add another cholesterol lowering medication to your regimen.   Borderline high blood pressure  Patient is currently not on any medication, blood pressure is controlled on diet and exercise only.  We have discussed target BP range and blood pressure goal. I have advised patient to check BP regularly and to call us  back or report to clinic if the numbers are consistently higher than 140/90. We discussed the importance of compliance with medical therapy and DASH diet recommended, consequences of uncontrolled hypertension discussed.   The patient was given clear instructions to go to ER or return to medical center if symptoms don't improve, worsen or new problems develop. The patient verbalized understanding. The patient was told to call to get lab results if they haven't heard anything in the next week.     Stephanie Coup, AGNP - [facility] [phone] 10/24/2015, 11:50 AM   Evaluation and management procedures were performed by the Advanced Practitioner under my supervision and collaboration. I have reviewed the Advanced Practitioner's note and chart, and I agree with the management and plan.   Jeanann Lewandowsky, MD, MHA, CPE, FACP, FAAP [facility] [place], [place] [phone]   10/24/2015, 5:57 PM

## 2015-10-25 ENCOUNTER — Encounter: Payer: Self-pay | Admitting: Internal Medicine

## 2015-11-01 ENCOUNTER — Telehealth: Payer: Self-pay | Admitting: *Deleted

## 2015-11-01 NOTE — Telephone Encounter (Signed)
-----   Message from Quentin Angst, MD sent at 10/31/2015  4:04 PM EST ----- Please inform patient of normal results

## 2015-11-01 NOTE — Telephone Encounter (Signed)
Medical Assistant left message on patient's home and cell voicemail. Voicemail states to give a call back to Nubia with [facility] at [phone].  

## 2015-11-01 NOTE — Telephone Encounter (Signed)
Patient verified DOB Patient made aware of lab results being normal. Patient had no further questions at this time.

## 2015-12-12 ENCOUNTER — Ambulatory Visit (AMBULATORY_SURGERY_CENTER): Payer: Self-pay | Admitting: *Deleted

## 2015-12-12 VITALS — Ht 71.0 in | Wt 162.0 lb

## 2015-12-12 DIAGNOSIS — Z1211 Encounter for screening for malignant neoplasm of colon: Secondary | ICD-10-CM

## 2015-12-12 NOTE — Progress Notes (Signed)
Patient denies any allergies to eggs or soy. Patient denies any problems with anesthesia/sedation. Patient denies any oxygen use at home and does not take any diet/weight loss medications.  

## 2015-12-26 ENCOUNTER — Ambulatory Visit (AMBULATORY_SURGERY_CENTER): Payer: BLUE CROSS/BLUE SHIELD | Admitting: Internal Medicine

## 2015-12-26 ENCOUNTER — Encounter: Payer: Self-pay | Admitting: Internal Medicine

## 2015-12-26 VITALS — BP 116/81 | HR 69 | Temp 97.8°F | Resp 12 | Ht 71.0 in | Wt 162.0 lb

## 2015-12-26 DIAGNOSIS — Z1211 Encounter for screening for malignant neoplasm of colon: Secondary | ICD-10-CM | POA: Diagnosis not present

## 2015-12-26 MED ORDER — SODIUM CHLORIDE 0.9 % IV SOLN: 500.0000 mL | INTRAVENOUS | Status: DC

## 2015-12-26 NOTE — Op Note (Signed)
[facility] Patient Name: Justin Odom Procedure Date: 12/26/2015 8:33 AM MRN: [id number] Endoscopist: Iva Boop , MD Age: 60 Date of Birth: April 03, 1956 Gender: Male Procedure:                Colonoscopy Indications:              Screening for colorectal malignant neoplasm Medicines:                Propofol per Anesthesia, Monitored Anesthesia Care Procedure:                Pre-Anesthesia Assessment:                           - Prior to the procedure, a History and Physical                            was performed, and patient medications and                            allergies were reviewed. The patient's tolerance of                            previous anesthesia was also reviewed. The risks                            and benefits of the procedure and the sedation                            options and risks were discussed with the patient.                            All questions were answered, and informed consent                            was obtained. Prior Anticoagulants: The patient                            last took aspirin on the day of the procedure. ASA                            Grade Assessment: II - A patient with mild systemic                            disease. After reviewing the risks and benefits,                            the patient was deemed in satisfactory condition to                            undergo the procedure.                           After obtaining informed consent, the colonoscope  was passed under direct vision. Throughout the                            procedure, the patient's blood pressure, pulse, and                            oxygen saturations were monitored continuously. The                            Model CF-HQ190L [id number](SN#[id number]) scope was introduced                            through the anus and advanced to the the cecum,                            identified by appendiceal orifice and  ileocecal                            valve. The colonoscopy was performed without                            difficulty. The patient tolerated the procedure                            well. The quality of the bowel preparation was                            excellent. The bowel preparation used was Miralax.                            The ileocecal valve, appendiceal orifice, and                            rectum were photographed. Scope In: 8:48:20 AM Scope Out: 8:59:23 AM Scope Withdrawal Time: 0 hours 7 minutes 50 seconds  Total Procedure Duration: 0 hours 11 minutes 3 seconds  Findings:                 The perianal and digital rectal examinations were                            normal. Pertinent negatives include normal prostate                            (size, shape, and consistency).                           A few large-mouthed diverticula were found in the                            sigmoid colon. There was no evidence of                            diverticular bleeding.  The exam was otherwise without abnormality on                            direct and retroflexion views. Complications:            No immediate complications. Estimated Blood Loss:     Estimated blood loss: none. Impression:               - Mild diverticulosis in the sigmoid colon. There                            was no evidence of diverticular bleeding.                           - The examination was otherwise normal on direct                            and retroflexion views.                           - No specimens collected. Recommendation:           - Patient has a contact number available for                            emergencies. The signs and symptoms of potential                            delayed complications were discussed with the                            patient. Return to normal activities tomorrow.                            Written discharge instructions were  provided to the                            patient.                           - Resume previous diet.                           - Continue present medications.                           - Repeat colonoscopy in 10 years for screening                            purposes. Iva Boop, MD 12/26/2015 9:05:02 AM This report has been signed electronically. CC Letter to:             Jeanann Lewandowsky M.D., MD

## 2015-12-26 NOTE — Progress Notes (Signed)
Report to PACU, RN, vss, BBS= Clear.  

## 2015-12-26 NOTE — Patient Instructions (Addendum)
No polyps or cancer!  You do have a condition called diverticulosis - common and not usually a problem. Please read the handout provided.  Great to see you again!  Next routine colonoscopy in 10 years - 2027  I appreciate the opportunity to care for you. Iva Booparl E. Kendyll Huettner, MD, FACG   YOU HAD AN ENDOSCOPIC PROCEDURE TODAY AT THE [facility]:   Refer to the procedure report that was given to you for any specific questions about what was found during the examination.  If the procedure report does not answer your questions, please call your gastroenterologist to clarify.  If you requested that your care partner not be given the details of your procedure findings, then the procedure report has been included in a sealed envelope for you to review at your convenience later.  YOU SHOULD EXPECT: Some feelings of bloating in the abdomen. Passage of more gas than usual.  Walking can help get rid of the air that was put into your GI tract during the procedure and reduce the bloating. If you had a lower endoscopy (such as a colonoscopy or flexible sigmoidoscopy) you may notice spotting of blood in your stool or on the toilet paper. If you underwent a bowel prep for your procedure, you may not have a normal bowel movement for a few days.  Please Note:  You might notice some irritation and congestion in your nose or some drainage.  This is from the oxygen used during your procedure.  There is no need for concern and it should clear up in a day or so.  SYMPTOMS TO REPORT IMMEDIATELY:   Following lower endoscopy (colonoscopy or flexible sigmoidoscopy):  Excessive amounts of blood in the stool  Significant tenderness or worsening of abdominal pains  Swelling of the abdomen that is new, acute  Fever of 100F or higher   For urgent or emergent issues, a gastroenterologist can be reached at any hour by calling [phone].   DIET: Your first meal following the procedure should be a  small meal and then it is ok to progress to your normal diet. Heavy or fried foods are harder to digest and may make you feel nauseous or bloated.  Likewise, meals heavy in dairy and vegetables can increase bloating.  Drink plenty of fluids but you should avoid alcoholic beverages for 24 hours.  ACTIVITY:  You should plan to take it easy for the rest of today and you should NOT DRIVE or use heavy machinery until tomorrow (because of the sedation medicines used during the test).    FOLLOW UP: Our staff will call the number listed on your records the next business day following your procedure to check on you and address any questions or concerns that you may have regarding the information given to you following your procedure. If we do not reach you, we will leave a message.  However, if you are feeling well and you are not experiencing any problems, there is no need to return our call.  We will assume that you have returned to your regular daily activities without incident.  Thank-you for choosing us for your healthcare needs today.  If any biopsies were taken you will be contacted by phone or by letter within the next 1-3 weeks.  Please call us at [phone] if you have not heard about the biopsies in 3 weeks.    SIGNATURES/CONFIDENTIALITY: You and/or your care partner have signed paperwork which will be entered into your electronic  medical record.  These signatures attest to the fact that that the information above on your After Visit Summary has been reviewed and is understood.  Full responsibility of the confidentiality of this discharge information lies with you and/or your care-partner.

## 2015-12-26 NOTE — Progress Notes (Signed)
Patient denies any allergies to eggs or soy. 

## 2015-12-27 ENCOUNTER — Telehealth: Payer: Self-pay

## 2015-12-27 NOTE — Telephone Encounter (Signed)
Left a message at [phone] for the pt to call us back if any questions or concerns. maw

## 2016-02-03 ENCOUNTER — Ambulatory Visit (INDEPENDENT_AMBULATORY_CARE_PROVIDER_SITE_OTHER): Payer: BLUE CROSS/BLUE SHIELD | Admitting: Physician Assistant

## 2016-02-03 VITALS — BP 104/62 | HR 73 | Temp 98.3°F | Resp 16 | Ht 72.0 in | Wt 160.0 lb

## 2016-02-03 DIAGNOSIS — H9201 Otalgia, right ear: Secondary | ICD-10-CM | POA: Diagnosis not present

## 2016-02-03 DIAGNOSIS — H6001 Abscess of right external ear: Secondary | ICD-10-CM | POA: Diagnosis not present

## 2016-02-03 MED ORDER — DOXYCYCLINE HYCLATE 100 MG PO CAPS: 100.0000 mg | ORAL_CAPSULE | Freq: Two times a day (BID) | ORAL | Status: AC

## 2016-02-03 NOTE — Patient Instructions (Signed)
     IF you received an x-ray today, you will receive an invoice from [facility]. Please contact [facility] at [phone] with questions or concerns regarding your invoice.   IF you received labwork today, you will receive an invoice from [facility]. Please contact [facility] at [phone] with questions or concerns regarding your invoice.   Our billing staff will not be able to assist you with questions regarding bills from these companies.  You will be contacted with the lab results as soon as they are available. The fastest way to get your results is to activate your My Chart account. Instructions are located on the last page of this paperwork. If you have not heard from us regarding the results in 2 weeks, please contact this office.      

## 2016-02-03 NOTE — Progress Notes (Signed)
02/03/2016 9:21 AM   DOB: 10/22/1955 / MRN: [id number]  SUBJECTIVE:  Justin Odom is a 60 y.o. male presenting for right sided ear pain that started 3 days ago. Complains that there is a mass in his ear canal at the 12 oclock position and states it is very tender.  He denies changes in hearing.    He has No Known Allergies.   He  has a past medical history of CVA (cerebral infarction); Hyperlipidemia; and Stroke (HCC) (11/25/2014).    He  reports that he has been smoking Cigarettes.  He has been smoking about 0.25 packs per day. He has never used smokeless tobacco. He reports that he drinks alcohol. He reports that he uses illicit drugs (Marijuana). He  has no sexual activity history on file. The patient  has no past surgical history on file.  His family history includes Cancer in his mother and sister; Pancreatic cancer in his mother. There is no history of Colon cancer.  Review of Systems  Constitutional: Negative for fever and chills.  HENT: Positive for ear pain. Negative for congestion, ear discharge, hearing loss and tinnitus.   Cardiovascular: Negative for chest pain.  Gastrointestinal: Negative for nausea.  Skin: Negative for rash.  Neurological: Negative for dizziness and headaches.    Problem list and medications reviewed and updated by myself where necessary, and exist elsewhere in the encounter.   OBJECTIVE:  BP 104/62 mmHg  Pulse 73  Temp(Src) 98.3 F (36.8 C)  Resp 16  Ht 6' (1.829 m)  Wt 160 lb (72.576 kg)  BMI 21.70 kg/m2  SpO2 99%  Physical Exam  Constitutional: He is oriented to person, place, and time. He appears well-developed. He does not appear ill.  HENT:  Right Ear: Tympanic membrane normal. No decreased hearing is noted.  Left Ear: Tympanic membrane normal. No decreased hearing is noted.  Ears:  Eyes: Conjunctivae and EOM are normal. Pupils are equal, round, and reactive to light.  Cardiovascular: Normal rate.   Pulmonary/Chest: Effort normal.    Abdominal: He exhibits no distension.  Musculoskeletal: Normal range of motion.  Neurological: He is alert and oriented to person, place, and time. No cranial nerve deficit. Coordination normal.  Skin: Skin is warm and dry. He is not diaphoretic.  Psychiatric: He has a normal mood and affect.  Nursing note and vitals reviewed.  Risk and benefits discussed and verbal consent obtained. Anesthetic allergies reviewed. Patient anesthetized using 1:1 mix of 2% lidocaine without epi. A 1 cm incision was made using a number 11 blade and purulent material was expressed.  The was not wound packed. The patient tolerated the procedure without difficulty.   A clean dressing was placed and wound care instructions were provided.       No results found for this or any previous visit (from the past 72 hour(s)).  No results found.  ASSESSMENT AND PLAN  Justin Odom was seen today for ear pain.  Diagnoses and all orders for this visit:  Abscess, ear canal, right: Drained.  Doubt a true otitis externa and given abscess formation most likely a staph infection.  Culture is out.  Will start doxy.  I have place a wick in the ear along with ciprodex and advised that he pull this out in 48 hours.  He will call tomorrow if he is not better so that I can start drops if needed. .  -     Wound culture -     doxycycline (VIBRAMYCIN) 100 MG  capsule; Take 1 capsule (100 mg total) by mouth 2 (two) times daily.   The patient was advised to call or return to clinic if he does not see an improvement in symptoms or to seek the care of the closest emergency department if he worsens with the above plan.   Justin Odom, MHS, PA-C [facility] 02/03/2016 9:21 AM

## 2016-02-06 ENCOUNTER — Encounter: Payer: Self-pay | Admitting: *Deleted

## 2016-02-06 LAB — WOUND CULTURE: GRAM STAIN: NONE SEEN

## 2016-05-28 ENCOUNTER — Ambulatory Visit (INDEPENDENT_AMBULATORY_CARE_PROVIDER_SITE_OTHER): Payer: BLUE CROSS/BLUE SHIELD | Admitting: Urgent Care

## 2016-05-28 ENCOUNTER — Ambulatory Visit (INDEPENDENT_AMBULATORY_CARE_PROVIDER_SITE_OTHER): Payer: BLUE CROSS/BLUE SHIELD

## 2016-05-28 VITALS — BP 118/70 | HR 73 | Temp 97.6°F | Resp 18 | Ht 72.0 in | Wt 161.6 lb

## 2016-05-28 DIAGNOSIS — M546 Pain in thoracic spine: Secondary | ICD-10-CM

## 2016-05-28 DIAGNOSIS — M25512 Pain in left shoulder: Secondary | ICD-10-CM

## 2016-05-28 DIAGNOSIS — M542 Cervicalgia: Secondary | ICD-10-CM

## 2016-05-28 LAB — CBC
HCT: 38.2 % — ABNORMAL LOW (ref 38.5–50.0)
Hemoglobin: 12.5 g/dL — ABNORMAL LOW (ref 13.2–17.1)
MCH: 29.4 pg (ref 27.0–33.0)
MCHC: 32.7 g/dL (ref 32.0–36.0)
MCV: 89.9 fL (ref 80.0–100.0)
MPV: 9.1 fL (ref 7.5–12.5)
Platelets: 247 10*3/uL (ref 140–400)
RBC: 4.25 MIL/uL (ref 4.20–5.80)
RDW: 14.4 % (ref 11.0–15.0)
WBC: 6.3 10*3/uL (ref 3.8–10.8)

## 2016-05-28 LAB — SEDIMENTATION RATE: SED RATE: 5 mm/h (ref 0–20)

## 2016-05-28 MED ORDER — CYCLOBENZAPRINE HCL 5 MG PO TABS: 5.0000 mg | ORAL_TABLET | Freq: Three times a day (TID) | ORAL | 1 refills | Status: DC | PRN

## 2016-05-28 MED ORDER — NAPROXEN SODIUM 550 MG PO TABS: 550.0000 mg | ORAL_TABLET | Freq: Two times a day (BID) | ORAL | 1 refills | Status: DC

## 2016-05-28 NOTE — Patient Instructions (Addendum)
You may take 500mg  of Tylenol every 6 hours alternating with Anaprox (which can be taken every 12 hours) for pain and inflammation associated with arthritis of your neck. You can use 1 pill of Flexeril 3 times daily as needed. But if this makes you sleepy, then use 2 pills at night only.   Degenerative Disk Disease Degenerative disk disease is a condition caused by the changes that occur in spinal disks as you grow older. Spinal disks are soft and compressible disks located between the bones of your spine (vertebrae). These disks act like shock absorbers. Degenerative disk disease can affect the whole spine. However, the neck and lower back are most commonly affected. Many changes can occur in the spinal disks with aging, such as:  The spinal disks may dry and shrink.  Small tears may occur in the tough, outer covering of the disk (annulus).  The disk space may become smaller due to loss of water.  Abnormal growths in the bone (spurs) may occur. This can put pressure on the nerve roots exiting the spinal canal, causing pain.  The spinal canal may become narrowed. RISK FACTORS   Being overweight.  Having a family history of degenerative disk disease.  Smoking.  There is increased risk if you are doing heavy lifting or have a sudden injury. SIGNS AND SYMPTOMS  Symptoms vary from person to person and may include:  Pain that varies in intensity. Some people have no pain, while others have severe pain. The location of the pain depends on the part of your backbone that is affected.  You will have neck or arm pain if a disk in the neck area is affected.  You will have pain in your back, buttocks, or legs if a disk in the lower back is affected.  Pain that becomes worse while bending, reaching up, or with twisting movements.  Pain that may start gradually and then get worse as time passes. It may also start after a major or minor injury.  Numbness or tingling in the arms or  legs. DIAGNOSIS  Your health care provider will ask you about your symptoms and about activities or habits that may cause the pain. He or she may also ask about any injuries, diseases, or treatments you have had. Your health care provider will examine you to check for the range of movement that is possible in the affected area, to check for strength in your extremities, and to check for sensation in the areas of the arms and legs supplied by different nerve roots. You may also have:   An X-ray of the spine.  Other imaging tests, such as MRI. TREATMENT  Your health care provider will advise you on the best plan for treatment. Treatment may include:  Medicines.  Rehabilitation exercises. HOME CARE INSTRUCTIONS   Follow proper lifting and walking techniques as advised by your health care provider.  Maintain good posture.  Exercise regularly as advised by your health care provider.  Perform relaxation exercises.  Change your sitting, standing, and sleeping habits as advised by your health care provider.  Change positions frequently.  Lose weight or maintain a healthy weight as advised by your health care provider.  Do not use any tobacco products, including cigarettes, chewing tobacco, or electronic cigarettes. If you need help quitting, ask your health care provider.  Wear supportive footwear.  Take medicines only as directed by your health care provider. SEEK MEDICAL CARE IF:   Your pain does not go away within 1-4  weeks.  You have significant appetite or weight loss. SEEK IMMEDIATE MEDICAL CARE IF:   Your pain is severe.  You notice weakness in your arms, hands, or legs.  You begin to lose control of your bladder or bowel movements.  You have fevers or night sweats. MAKE SURE YOU:   Understand these instructions.  Will watch your condition.  Will get help right away if you are not doing well or get worse.   This information is not intended to replace advice  given to you by your health care provider. Make sure you discuss any questions you have with your health care provider.   Document Released: 07/01/2007 Document Revised: 09/24/2014 Document Reviewed: 01/05/2014 Elsevier Interactive Patient Education 2016 [employer].    IF you received an x-ray today, you will receive an invoice from [facility]. Please contact [facility] at [phone] with questions or concerns regarding your invoice.   IF you received labwork today, you will receive an invoice from [employer] [facility]. Please contact [facility] at [phone] with questions or concerns regarding your invoice.   Our billing staff will not be able to assist you with questions regarding bills from these companies.  You will be contacted with the lab results as soon as they are available. The fastest way to get your results is to activate your My Chart account. Instructions are located on the last page of this paperwork. If you have not heard from [place] regarding the results in 2 weeks, please contact this office.

## 2016-05-28 NOTE — Progress Notes (Signed)
MRN: [id number] DOB: 08/29/1956  Subjective:   Justin Odom is a 60 y.o. male presenting for chief complaint of Shoulder Pain (left shoulder pain spreading down to elbow x2-3 days)  Reports 3 day history of left shoulder pain that is now spreading to his left forearm. He has also started having neck pain. Pain is achy and dull in nature, worse with movement. Has tried ibuprofen once with minimal relief. Denies fever, swelling, trauma, redness, numbness or tingling. Denies history of neck and back injuries. Reports history of stroke, takes aspirin and Crestor daily for this.   Justin Odom has a current medication list which includes the following prescription(s): aspirin ec, atorvastatin, and multivitamin with minerals. Also has No Known Allergies.  Justin Odom  has a past medical history of CVA (cerebral infarction); Hyperlipidemia; and Stroke (HCC) (11/25/2014). Also  has no past surgical history on file.  Objective:   Vitals: BP 118/70   Pulse 73   Temp 97.6 F (36.4 C) (Oral)   Resp 18   Ht 6' (1.829 m)   Wt 161 lb 9.6 oz (73.3 kg)   SpO2 100%   BMI 21.92 kg/m   Physical Exam  Constitutional: He is oriented to person, place, and time. He appears well-developed and well-nourished.  Cardiovascular: Normal rate.   Pulmonary/Chest: Effort normal.  Musculoskeletal:       Left shoulder: He exhibits decreased range of motion (external rotation). He exhibits no tenderness (negative Hawkin and Neer test), no bony tenderness, no swelling, no effusion, no crepitus, no deformity, no spasm and normal strength.       Left elbow: He exhibits normal range of motion, no swelling, no effusion, no deformity and no laceration. Tenderness (over brachioradialis) found.       Cervical back: He exhibits decreased range of motion (lateral rotation to the left) and spasm (throughout trapezius, L>R). He exhibits no bony tenderness, no swelling, no edema, no deformity and no laceration.  Negative Spurling  maneuver.  Neurological: He is alert and oriented to person, place, and time. He has normal reflexes.   Dg Cervical Spine Complete  Result Date: 05/28/2016 CLINICAL DATA:  60 year old male with cervical neck pain and left arm pain. Initial encounter. EXAM: CERVICAL SPINE - COMPLETE 4+ VIEW COMPARISON:  None. FINDINGS: Straightening of cervical lordosis. Normal prevertebral soft tissue contour. Bilateral posterior element alignment is within normal limits. Cervical AP alignment within normal limits. Mild levoconvex scoliotic curvature at the cervicothoracic junction. Normal C1-C2 alignment. Mild to moderate disc space loss with mild endplate spurring at C5-C6. Negative lung apices. IMPRESSION: 1.  No acute osseous abnormality in the cervical spine. 2. Chronic C5-C6 disc and endplate degeneration. Electronically Signed   By: Odessa FlemingH  Hall M.D.   On: 05/28/2016 14:13   Dg Shoulder Left  Result Date: 05/28/2016 CLINICAL DATA:  Left shoulder pain. EXAM: LEFT SHOULDER - 2+ VIEW COMPARISON:  None. FINDINGS: There is no evidence of fracture or dislocation. There is no evidence of arthropathy or other focal bone abnormality. Soft tissues are unremarkable. IMPRESSION: Normal left shoulder. Electronically Signed   By: Lupita RaiderJames  Green Jr, M.D.   On: 05/28/2016 14:11    Assessment and Plan :   1. Left shoulder pain 2. Neck pain 3. Left-sided thoracic back pain - I suspect patient may have nerve irritation causing his left arm pain secondary to degenerative changes of his neck. I recommended conservative management with this. F/u in 1 week, consider referral to PT, short steroid course or referral to  ortho.  Wallis Bamberg, PA-C [facility] [phone] 05/28/2016 1:42 PM

## 2016-06-01 ENCOUNTER — Telehealth: Payer: Self-pay

## 2016-06-01 ENCOUNTER — Telehealth: Payer: Self-pay | Admitting: Internal Medicine

## 2016-06-01 NOTE — Telephone Encounter (Signed)
[facility] Patients AVS states RECOMMENDATION is short term steriod shot, PT referral or referral to Ortho. Does patient need appointment with you to obtain these recommendations? [facility] but I do not see a scheduled FU in the system.

## 2016-06-01 NOTE — Telephone Encounter (Signed)
Pt. Called requesting to speak with his PCP b/c he went to urgent care and needs to ask his PCP some questions. Please f/u

## 2016-06-01 NOTE — Telephone Encounter (Signed)
Pt saw Marquita PalmsMario and was told to call back today for his lab results.  He has already left message on the lab answering machine and has not heard anything.  Please call asap [phone]

## 2016-06-04 ENCOUNTER — Ambulatory Visit (INDEPENDENT_AMBULATORY_CARE_PROVIDER_SITE_OTHER): Payer: BLUE CROSS/BLUE SHIELD | Admitting: Urgent Care

## 2016-06-04 VITALS — BP 138/80 | HR 85 | Temp 98.5°F | Resp 16 | Ht 72.0 in | Wt 162.0 lb

## 2016-06-04 DIAGNOSIS — M79602 Pain in left arm: Secondary | ICD-10-CM

## 2016-06-04 DIAGNOSIS — Z8673 Personal history of transient ischemic attack (TIA), and cerebral infarction without residual deficits: Secondary | ICD-10-CM | POA: Diagnosis not present

## 2016-06-04 DIAGNOSIS — E785 Hyperlipidemia, unspecified: Secondary | ICD-10-CM | POA: Diagnosis not present

## 2016-06-04 LAB — COMPLETE METABOLIC PANEL WITH GFR
ALBUMIN: 4 g/dL (ref 3.6–5.1)
ALK PHOS: 57 U/L (ref 40–115)
ALT: 8 U/L — AB (ref 9–46)
AST: 16 U/L (ref 10–35)
BILIRUBIN TOTAL: 0.3 mg/dL (ref 0.2–1.2)
BUN: 13 mg/dL (ref 7–25)
CO2: 27 mmol/L (ref 20–31)
CREATININE: 0.82 mg/dL (ref 0.70–1.33)
Calcium: 9 mg/dL (ref 8.6–10.3)
Chloride: 105 mmol/L (ref 98–110)
GFR, Est Non African American: >89 mL/min (ref >=60)
Glucose, Bld: 84 mg/dL (ref 65–99)
Potassium: 4.1 mmol/L (ref 3.5–5.3)
Sodium: 138 mmol/L (ref 135–146)
TOTAL PROTEIN: 6.7 g/dL (ref 6.1–8.1)

## 2016-06-04 MED ORDER — PREDNISONE 20 MG PO TABS: ORAL_TABLET | ORAL | 0 refills | Status: DC

## 2016-06-04 NOTE — Telephone Encounter (Signed)
Called pt. He was in today for a visit and addressed the lab results issue with him.

## 2016-06-04 NOTE — Patient Instructions (Signed)
Continue using Tylenol. Stop Anaprox. Start the short steroid course and if you are not better in 2 weeks, let me know and we will pursue a referral to a specialist.

## 2016-06-04 NOTE — Progress Notes (Signed)
    MRN: [id number] DOB: 09/09/1956  Subjective:   Justin Odom is a 60 y.o. male presenting for Follow-up (back )  Reports that he has ongoing left forearm pain. 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 Otherwise his pain is mild. Pain is like a throbbing sensation. He has been using APAP, Flexeril, Anaprox. He does have some concerns about using Anaprox with his history of stroke. Denies redness, swelling, trauma, fever. Denies chest pain, shob, heart racing. Denies headache, confusion, weakness. Smokes 1 cigarettes after eating, has been cutting back.  Justin Odom has a current medication list which includes the following prescription(s): aspirin ec, atorvastatin, cyclobenzaprine, multivitamin with minerals, and naproxen sodium. Also has No Known Allergies.  Justin Odom  has a past medical history of CVA (cerebral infarction); Hyperlipidemia; and Stroke (HCC) (11/25/2014). Also  has no past surgical history on file.  Objective:   Vitals: BP 138/80   Pulse 85   Temp 98.5 F (36.9 C) (Oral)   Resp 16   Ht 6' (1.829 m)   Wt 162 lb (73.5 kg)   SpO2 98%   BMI 21.97 kg/m   BP Readings from Last 3 Encounters:  06/04/16 138/80  05/28/16 118/70  02/03/16 104/62   Physical Exam  Constitutional: He is oriented to person, place, and time. He appears well-developed and well-nourished.  Cardiovascular: Normal rate.   Pulmonary/Chest: Effort normal.  Musculoskeletal:       Left elbow: He exhibits normal range of motion, no swelling (Right forearm measures 27cm at 10cm from olecranon process compared to 26.5cm for left forearm), no effusion, no deformity and no laceration. No tenderness (with resisted pronation, supination, wrist extension or flexion) found.  Neurological: He is alert and oriented to person, place, and time.   Assessment and Plan :   This case was precepted with Dr. Clelia CroftShaw.  1. Hyperlipidemia 2. Pain of left upper extremity 3. History of  stroke - Will start short steroid course. Use 40mg  prednisone in the morning for 5 days. I recommended patient purchase a steering wheel cover to try and help with not having to grip the wheel as hard.   Justin BambergMario Donyea Beverlin, PA-C [facility] [phone] 06/04/2016 2:20 PM

## 2016-06-05 ENCOUNTER — Encounter: Payer: Self-pay | Admitting: Urgent Care

## 2016-06-08 ENCOUNTER — Telehealth: Payer: Self-pay | Admitting: Internal Medicine

## 2016-06-08 ENCOUNTER — Telehealth: Payer: Self-pay

## 2016-06-08 NOTE — Telephone Encounter (Signed)
MA unable to reach patient or leave a message on any contact numbers provided.   !!!Please ask patient to express his concerns with front office since MA is unable to reach the patient on a returning phone call.!!!

## 2016-06-08 NOTE — Telephone Encounter (Signed)
Pt would like a refill on his predniSONE (DELTASONE) 20 MG tablet [[id number]]. [id number] - [place], [place] - [address] Please advise at [phone]

## 2016-06-08 NOTE — Telephone Encounter (Signed)
Patient verified DOB Patient is aware of prednisone being a short term drug. If patient continues to have concern or need he should contact the office in which it was prescribed and request a refill. [facility]. No further questions at this time.

## 2016-06-08 NOTE — Telephone Encounter (Signed)
Pt calling requesting to speak to PCP stating he has left three messages prior to today Pt states he was diagnosed with degenerative disc disease and has been going to the urgent care frequently  Pt requested an appointment with PCP but stated the only reason for appointment was to talk. Informed pt an appointment to talk could not be done, asked pt if he was having any current symptoms he would like to be seen for and pt stated he would be find another PCP and hung up the phone

## 2016-06-09 NOTE — Telephone Encounter (Signed)
Thanks!! Watching his mailbox for 3 days.

## 2016-06-09 NOTE — Telephone Encounter (Signed)
I have called patient,advised him you recommended a short steroid course. Use 40mg  prednisone in the morning for 5 days. Then use the Naproxen /Flexeril prn, he has voiced understanding, will follow up with you if no improvement, to you [facility]

## 2016-07-04 ENCOUNTER — Other Ambulatory Visit: Payer: Self-pay | Admitting: Urgent Care

## 2016-07-04 DIAGNOSIS — M47812 Spondylosis without myelopathy or radiculopathy, cervical region: Secondary | ICD-10-CM

## 2016-07-04 DIAGNOSIS — M542 Cervicalgia: Secondary | ICD-10-CM

## 2016-07-04 DIAGNOSIS — M546 Pain in thoracic spine: Secondary | ICD-10-CM

## 2016-07-04 DIAGNOSIS — M25512 Pain in left shoulder: Secondary | ICD-10-CM

## 2016-07-04 HISTORY — DX: Spondylosis without myelopathy or radiculopathy, cervical region: M47.812

## 2016-07-04 NOTE — Telephone Encounter (Signed)
Did he take the prednisone? Does the cyclobenzaprine help?  Looks like Mr. Urban GibsonMani planned specialty referral if symptoms persisted.  Meds ordered this encounter  Medications  . cyclobenzaprine (FLEXERIL) 5 MG tablet    Sig: TAKE 1 TABLET (5 MG TOTAL) BY MOUTH 3 (THREE) TIMES DAILY AS NEEDED FOR MUSCLE SPASMS.    Dispense:  60 tablet    Refill:  0

## 2016-07-04 NOTE — Telephone Encounter (Signed)
Tried to call pt to ask Chelle's questions, but his VM not set up

## 2016-07-04 NOTE — Telephone Encounter (Signed)
05/28/16 saw mani and had rx. For cyclobenzaprine. Seen again for f/u 06/04/16 with mani. I confirmed with pharmacy he had it filled  9/11 and 06/13/16. Please advise if refill appropriate

## 2016-07-05 NOTE — Telephone Encounter (Signed)
Spoke to pt who reported that the prednisone, and then returning to use of naproxen afterward has seemed to help. He only occasionally has some pain and numbness in his L # 4 and 5 fingers, but not in whole hand any longer. Pt stated that Urban GibsonMani had told him about some exercises that could help but I don't see any printed out at [facility]. Pt wasn't sure if Urban GibsonMani meant some that could be done now, or after he saw a sepcialist. Please advise what exercises would be appropriate.

## 2016-07-05 NOTE — Telephone Encounter (Signed)
It's not clear to me from the documentation if Mr. Justin Odom thought the pain was coming from the neck or due to the patient grasping the steering wheel so tightly, so it's not clear which exercises he intended.  I've refilled the cyclobenzaprine, and let's leave the message regarding the exercises for Mr. Justin Odom when he returns.

## 2016-07-06 NOTE — Telephone Encounter (Signed)
Notified  Pt that his RF was sent to pharm and that we will be back in touch about exercises after Urban GibsonMani has a chance to respond after his return. Pt thanked us and agreed.

## 2016-07-11 NOTE — Telephone Encounter (Signed)
Spoke with patient, reviewed back exercises. He reports significant improvement in his symptoms. Feels that he is even better than before. RTC as needed.

## 2016-08-02 ENCOUNTER — Ambulatory Visit: Payer: BLUE CROSS/BLUE SHIELD

## 2016-08-27 ENCOUNTER — Ambulatory Visit (INDEPENDENT_AMBULATORY_CARE_PROVIDER_SITE_OTHER): Payer: BLUE CROSS/BLUE SHIELD | Admitting: Family Medicine

## 2016-08-27 VITALS — BP 132/80 | HR 73 | Temp 98.3°F | Resp 17 | Ht 72.0 in | Wt 164.0 lb

## 2016-08-27 DIAGNOSIS — M545 Low back pain, unspecified: Secondary | ICD-10-CM

## 2016-08-27 NOTE — Progress Notes (Signed)
Chief Complaint  Patient presents with  . Back Pain    Since saturday, after moving a TV stand     HPI   Pt reports that he squat to lift a large tv stand and felt a pull in his lower back 2 days ago He reports that yesterday he rested and now he feels like his flexibility is improved He reports that his pain improved He denies radiating pain He states that he had difficulty flexing at the waist but now he feels better today     Past Medical History:  Diagnosis Date  . CVA (cerebral infarction)   . Hyperlipidemia   . Stroke [facility](HCC) 11/25/2014    Current Outpatient Prescriptions  Medication Sig Dispense Refill  . aspirin EC 325 MG tablet Take 1 tablet (325 mg total) by mouth daily. 90 tablet 3  . atorvastatin (LIPITOR) 40 MG tablet Take 1 tablet (40 mg total) by mouth daily. 90 tablet 3  . Multiple Vitamins-Minerals (MULTIVITAMIN WITH MINERALS) tablet Take 1 tablet by mouth daily. Reported on 10/04/2015    . predniSONE (DELTASONE) 20 MG tablet Take 2 tablets daily with breakfast. 10 tablet 0  . cyclobenzaprine (FLEXERIL) 5 MG tablet TAKE 1 TABLET (5 MG TOTAL) BY MOUTH 3 (THREE) TIMES DAILY AS NEEDED FOR MUSCLE SPASMS. (Patient not taking: Reported on 08/27/2016) 60 tablet 0   No current facility-administered medications for this visit.     Allergies: No Known Allergies  No past surgical history on file.  Social History   Social History  . Marital status: Divorced    Spouse name: N/A  . Number of children: N/A  . Years of education: N/A   Social History Main Topics  . Smoking status: Current Every Day Smoker    Packs/day: 0.25    Types: Cigarettes    Last attempt to quit: 12/21/2014  . Smokeless tobacco: Never Used     Comment: 3 cigarettes a day  . Alcohol use 0.0 oz/week     Comment: occ  . Drug use:     Types: Marijuana  . Sexual activity: Not Asked   Other Topics Concern  . None   Social History Narrative  . None    Review of Systems  Eyes: Negative  for blurred vision and double vision.  Gastrointestinal: Negative for nausea and vomiting.  Genitourinary: Negative for dysuria, frequency and urgency.  Musculoskeletal: Positive for back pain. Negative for falls and myalgias.  Neurological: Negative for dizziness and tremors.    Objective: Vitals:   08/27/16 1408  BP: 132/80  Pulse: 73  Resp: 17  Temp: 98.3 F (36.8 C)  TempSrc: Oral  SpO2: 99%  Weight: 164 lb (74.4 kg)  Height: 6' (1.829 m)    Physical Exam  Constitutional: He appears well-developed and well-nourished.  HENT:  Head: Normocephalic and atraumatic.  Cardiovascular: Normal rate, regular rhythm and normal heart sounds.   No murmur heard. Pulmonary/Chest: Effort normal and breath sounds normal. No respiratory distress. He has no wheezes.  Musculoskeletal:       Lumbar back: Normal. He exhibits normal range of motion, no tenderness, no bony tenderness, no swelling, no edema, no deformity, no laceration, no pain, no spasm and normal pulse.    Assessment and Plan Ramon Dredgedward was seen today for back pain.  Diagnoses and all orders for this visit:  Acute bilateral low back pain without sciatica  -  Continue flexeril prn -  Continue stretches -  Follow up PRN   Ares Tegtmeyer A  Nolon Rod

## 2016-08-27 NOTE — Patient Instructions (Addendum)
IF you received an x-ray today, you will receive an invoice from [facility]. Please contact [facility] at [phone] with questions or concerns regarding your invoice.   IF you received labwork today, you will receive an invoice from [facility]. Please contact [facility] at [phone] with questions or concerns regarding your invoice.   Our billing staff will not be able to assist you with questions regarding bills from these companies.  You will be contacted with the lab results as soon as they are available. The fastest way to get your results is to activate your My Chart account. Instructions are located on the last page of this paperwork. If you have not heard from us regarding the results in 2 weeks, please contact this office.      Low Back Sprain Rehab Ask your health care provider which exercises are safe for you. Do exercises exactly as told by your health care provider and adjust them as directed. It is normal to feel mild stretching, pulling, tightness, or discomfort as you do these exercises, but you should stop right away if you feel sudden pain or your pain gets worse. Do not begin these exercises until told by your health care provider. Stretching and range of motion exercises These exercises warm up your muscles and joints and improve the movement and flexibility of your back. These exercises also help to relieve pain, numbness, and tingling. Exercise A: Lumbar rotation 1. Lie on your back on a firm surface and bend your knees. 2. Straighten your arms out to your sides so each arm forms an "L" shape with a side of your body (a 90 degree angle). 3. Slowly move both of your knees to one side of your body until you feel a stretch in your lower back. Try not to let your shoulders move off of the floor. 4. Hold for __________ seconds. 5. Tense your abdominal muscles and slowly move your knees back to the starting  position. 6. Repeat this exercise on the other side of your body. Repeat __________ times. Complete this exercise __________ times a day. Exercise B: Prone extension on elbows 1. Lie on your abdomen on a firm surface. 2. Prop yourself up on your elbows. 3. Use your arms to help lift your chest up until you feel a gentle stretch in your abdomen and your lower back.  This will place some of your body weight on your elbows. If this is uncomfortable, try stacking pillows under your chest.  Your hips should stay down, against the surface that you are lying on. Keep your hip and back muscles relaxed. 4. Hold for __________ seconds. 5. Slowly relax your upper body and return to the starting position. Repeat __________ times. Complete this exercise __________ times a day. Strengthening exercises These exercises build strength and endurance in your back. Endurance is the ability to use your muscles for a long time, even after they get tired. Exercise C: Pelvic tilt 1. Lie on your back on a firm surface. Bend your knees and keep your feet flat. 2. Tense your abdominal muscles. Tip your pelvis up toward the ceiling and flatten your lower back into the floor.  To help with this exercise, you may place a small towel under your lower back and try to push your back into the towel. 3. Hold for __________ seconds. 4. Let your muscles relax completely before you repeat this exercise. Repeat __________ times. Complete this exercise __________ times a day. Exercise D:  Alternating arm and leg raises 1. Get on your hands and knees on a firm surface. If you are on a hard floor, you may want to use padding to cushion your knees, such as an exercise mat. 2. Line up your arms and legs. Your hands should be below your shoulders, and your knees should be below your hips. 3. Lift your left leg behind you. At the same time, raise your right arm and straighten it in front of you.  Do not lift your leg higher than your  hip.  Do not lift your arm higher than your shoulder.  Keep your abdominal and back muscles tight.  Keep your hips facing the ground.  Do not arch your back.  Keep your balance carefully, and do not hold your breath. 4. Hold for __________ seconds. 5. Slowly return to the starting position and repeat with your right leg and your left arm. Repeat __________ times. Complete this exercise __________ times a day. Exercise E: Abdominal set with straight leg raise 1. Lie on your back on a firm surface. 2. Bend one of your knees and keep your other leg straight. 3. Tense your abdominal muscles and lift your straight leg up, 4-6 inches (10-15 cm) off the ground. 4. Keep your abdominal muscles tight and hold for __________ seconds.  Do not hold your breath.  Do not arch your back. Keep it flat against the ground. 5. Keep your abdominal muscles tense as you slowly lower your leg back to the starting position. 6. Repeat with your other leg. Repeat __________ times. Complete this exercise __________ times a day. Posture and body mechanics   Body mechanics refers to the movements and positions of your body while you do your daily activities. Posture is part of body mechanics. Good posture and healthy body mechanics can help to relieve stress in your body's tissues and joints. Good posture means that your spine is in its natural S-curve position (your spine is neutral), your shoulders are pulled back slightly, and your head is not tipped forward. The following are general guidelines for applying improved posture and body mechanics to your everyday activities. Standing   When standing, keep your spine neutral and your feet about hip-width apart. Keep a slight bend in your knees. Your ears, shoulders, and hips should line up.  When you do a task in which you stand in one place for a long time, place one foot up on a stable object that is 2-4 inches (5-10 cm) high, such as a footstool. This helps  keep your spine neutral. Sitting  When sitting, keep your spine neutral and keep your feet flat on the floor. Use a footrest, if necessary, and keep your thighs parallel to the floor. Avoid rounding your shoulders, and avoid tilting your head forward.  When working at a desk or a computer, keep your desk at a height where your hands are slightly lower than your elbows. Slide your chair under your desk so you are close enough to maintain good posture.  When working at a computer, place your monitor at a height where you are looking straight ahead and you do not have to tilt your head forward or downward to look at the screen. Resting   When lying down and resting, avoid positions that are most painful for you.  If you have pain with activities such as sitting, bending, stooping, or squatting (flexion-based activities), lie in a position in which your body does not bend very much. For example,  avoid curling up on your side with your arms and knees near your chest (fetal position).  If you have pain with activities such as standing for a long time or reaching with your arms (extension-based activities), lie with your spine in a neutral position and bend your knees slightly. Try the following positions:  Lying on your side with a pillow between your knees.  Lying on your back with a pillow under your knees. Lifting   When lifting objects, keep your feet at least shoulder-width apart and tighten your abdominal muscles.  Bend your knees and hips and keep your spine neutral. It is important to lift using the strength of your legs, not your back. Do not lock your knees straight out.  Always ask for help to lift heavy or awkward objects. This information is not intended to replace advice given to you by your health care provider. Make sure you discuss any questions you have with your health care provider. Document Released: 09/03/2005 Document Revised: 05/10/2016 Document Reviewed:  06/15/2015 Elsevier Interactive Patient Education  2017 [employer].

## 2016-09-26 ENCOUNTER — Other Ambulatory Visit: Payer: Self-pay | Admitting: Urgent Care

## 2016-09-26 DIAGNOSIS — M546 Pain in thoracic spine: Secondary | ICD-10-CM

## 2016-09-26 DIAGNOSIS — M542 Cervicalgia: Secondary | ICD-10-CM

## 2016-09-26 DIAGNOSIS — M25512 Pain in left shoulder: Secondary | ICD-10-CM

## 2016-12-04 ENCOUNTER — Other Ambulatory Visit: Payer: Self-pay | Admitting: Family Medicine

## 2016-12-14 ENCOUNTER — Other Ambulatory Visit: Payer: Self-pay | Admitting: Internal Medicine

## 2016-12-14 DIAGNOSIS — E785 Hyperlipidemia, unspecified: Secondary | ICD-10-CM

## 2016-12-24 ENCOUNTER — Ambulatory Visit (INDEPENDENT_AMBULATORY_CARE_PROVIDER_SITE_OTHER): Payer: BLUE CROSS/BLUE SHIELD | Admitting: Family Medicine

## 2016-12-24 ENCOUNTER — Encounter: Payer: Self-pay | Admitting: Physician Assistant

## 2016-12-24 VITALS — BP 135/82 | HR 69 | Temp 98.2°F | Resp 16 | Wt 168.6 lb

## 2016-12-24 DIAGNOSIS — M79674 Pain in right toe(s): Secondary | ICD-10-CM

## 2016-12-24 NOTE — Patient Instructions (Addendum)
Thank you for coming in,   You can soak your feet in epson salt if this occurs again.   You can try cutting your nail straight across to help prevent an ingrown toe nail.   Please try to make sure that you wear comfortable shoes when working all day.     Please feel free to call with any questions or concerns at any time, at [phone]. --Dr. Jordan Likes     IF you received an x-ray today, you will receive an invoice from [facility]. Please contact [facility] at [phone] with questions or concerns regarding your invoice.   IF you received labwork today, you will receive an invoice from [facility]. Please contact [facility] at [phone] with questions or concerns regarding your invoice.   Our billing staff will not be able to assist you with questions regarding bills from these companies.  You will be contacted with the lab results as soon as they are available. The fastest way to get your results is to activate your My Chart account. Instructions are located on the last page of this paperwork. If you have not heard from [place] regarding the results in 2 weeks, please contact this office.

## 2016-12-24 NOTE — Progress Notes (Signed)
  Justin Odom - 61 y.o. male MRN [id number]  Date of birth: Jul 17, 1956  SUBJECTIVE:  Including CC & ROS.  Chief Complaint  Patient presents with  . Pain    RIGHT GREAT TOE PAIN X 3 DAYS, NO INJURY   Justin Odom is a 61 yo M that is presenting with right great toe pain. The pain has resolved since he made the appointment. The pain was worse on Saturday but he wanted to keep his appointment. He denies any injury to it. The 1st MTP joint was red and swollen on Saturday. He denies any history of gout. He denies any medication use. He hasn't had similar symptoms. He works on his feet all day.   ROS: No unexpected weight loss, fever, chills, swelling, instability, muscle pain, numbness/tingling, redness, otherwise see HPI    HISTORY: Past Medical, Surgical, Social, and Family History Reviewed & Updated per EMR.   Pertinent Historical Findings include: PMSHx -  CVA   PHYSICAL EXAM:  VS: BP:135/82  HR:69bpm  TEMP:98.2 F (36.8 C)(Oral)  RESP:100 %  HT:    WT:168 lb 9.6 oz (76.5 kg)  BMI:  PHYSICAL EXAM: Gen: NAD, alert, cooperative with exam, well-appearing HEENT: NCAT, EOMI, clear conjunctiva, oropharynx clear, supple neck CV: RRR, good S1/S2, no murmur, no edema, capillary refill brisk  Resp: CTABL, no wheezes, non-labored Abd: SNTND, BS present, no guarding or organomegaly Skin: no rashes, normal turgor  Neuro: no gross deficits.  Psych: alert and oriented Right foot:  No obvious swelling  No TTP of the MT's, posterior tib or peroneal tendons  No TTP of the great toe or the 1st MTP joint  No abnormal callus formation  Normal ankle ROM  Normal strength  Limited great toe flexion, normal extension  Neurovascularly intact   ASSESSMENT & PLAN:   Pain of right great toe Pain is likely related to an ingrown toe nail. His pain was closer to the nail as opposed to the 1st MTP joint.  - provided with precautions  - f/u PRN

## 2016-12-24 NOTE — Assessment & Plan Note (Signed)
Pain is likely related to an ingrown toe nail. His pain was closer to the nail as opposed to the 1st MTP joint.  - provided with precautions  - f/u PRN

## 2016-12-25 ENCOUNTER — Telehealth: Payer: Self-pay | Admitting: Internal Medicine

## 2016-12-25 DIAGNOSIS — E785 Hyperlipidemia, unspecified: Secondary | ICD-10-CM

## 2016-12-25 MED ORDER — ATORVASTATIN CALCIUM 40 MG PO TABS: 40.0000 mg | ORAL_TABLET | Freq: Every day | ORAL | 0 refills | Status: DC

## 2016-12-25 NOTE — Telephone Encounter (Signed)
Patient called the office to request medication refill for atorvastatin (LIPITOR) 40 MG tablet. [facility] [address]   Thank you.

## 2016-12-25 NOTE — Telephone Encounter (Signed)
Refilled atorvastatin x 30 days - patient must have office visit for further refills.

## 2017-01-10 ENCOUNTER — Telehealth: Payer: Self-pay | Admitting: Internal Medicine

## 2017-01-10 NOTE — Telephone Encounter (Signed)
PT called to try to schedule an appt with the PCP, since the last time he saw him was on 10/2015 and was refer to do a colonoscopy and so far still no one call him with the result, also he claim that he been leaving msg and no one has been calling him back, please follow with PT

## 2017-01-14 NOTE — Telephone Encounter (Signed)
Verify if the patient is being seen at [facility] or [facility]. Patient has not been seen in over a year. Patient needs to have referral placed by his PCP and he should only report to his PCP's office.

## 2017-02-18 ENCOUNTER — Ambulatory Visit: Payer: BLUE CROSS/BLUE SHIELD | Attending: Family Medicine | Admitting: Family Medicine

## 2017-02-18 ENCOUNTER — Encounter: Payer: Self-pay | Admitting: Family Medicine

## 2017-02-18 VITALS — BP 136/76 | HR 76 | Temp 98.2°F | Resp 18 | Ht 71.5 in | Wt 164.0 lb

## 2017-02-18 DIAGNOSIS — F17200 Nicotine dependence, unspecified, uncomplicated: Secondary | ICD-10-CM

## 2017-02-18 DIAGNOSIS — Z79899 Other long term (current) drug therapy: Secondary | ICD-10-CM | POA: Diagnosis not present

## 2017-02-18 DIAGNOSIS — Z8673 Personal history of transient ischemic attack (TIA), and cerebral infarction without residual deficits: Secondary | ICD-10-CM | POA: Insufficient documentation

## 2017-02-18 DIAGNOSIS — Z1159 Encounter for screening for other viral diseases: Secondary | ICD-10-CM | POA: Diagnosis not present

## 2017-02-18 DIAGNOSIS — Z7982 Long term (current) use of aspirin: Secondary | ICD-10-CM | POA: Diagnosis not present

## 2017-02-18 DIAGNOSIS — E785 Hyperlipidemia, unspecified: Secondary | ICD-10-CM

## 2017-02-18 DIAGNOSIS — Z720 Tobacco use: Secondary | ICD-10-CM | POA: Diagnosis not present

## 2017-02-18 HISTORY — DX: Tobacco use: Z72.0

## 2017-02-18 HISTORY — DX: Nicotine dependence, unspecified, uncomplicated: F17.200

## 2017-02-18 MED ORDER — ATORVASTATIN CALCIUM 40 MG PO TABS: 40.0000 mg | ORAL_TABLET | Freq: Every day | ORAL | 6 refills | Status: DC

## 2017-02-18 NOTE — Progress Notes (Signed)
Subjective:  Patient ID: Justin Odom, male    DOB: 12/21/1955  Age: 61 y.o. MRN: [id number]  CC: Establish Care   HPI Justin Odom a 61 year old male with a history of hyperlipidemia, previous CVA (with no residual deficits), tobacco abuse who presents today to establish care with me.  He endorses compliance with his statin, low cholesterol diet and exercise but continues to smoke every now and then. He is trying to quit smoking and would like to do this on his own with the aid of nicotine gum.  He has no acute concerns today.   Past Medical History:  Diagnosis Date  . CVA (cerebral infarction)   . Hyperlipidemia   . Stroke [facility]) 11/25/2014    History reviewed. No pertinent surgical history.  No Known Allergies   Outpatient Medications Prior to Visit  Medication Sig Dispense Refill  . aspirin EC 325 MG tablet Take 1 tablet (325 mg total) by mouth daily. 90 tablet 3  . atorvastatin (LIPITOR) 40 MG tablet Take 1 tablet (40 mg total) by mouth daily. 30 tablet 0  . EPIPEN 2-PAK 0.3 MG/0.3ML SOAJ injection INJECT INTO THE MUSCLE ONCE AS DIRECTED 2 Device 0  . Multiple Vitamins-Minerals (MULTIVITAMIN WITH MINERALS) tablet Take 1 tablet by mouth daily. Reported on 10/04/2015    . cyclobenzaprine (FLEXERIL) 5 MG tablet TAKE 1 TABLET (5 MG TOTAL) BY MOUTH 3 (THREE) TIMES DAILY AS NEEDED FOR MUSCLE SPASMS. (Patient not taking: Reported on 08/27/2016) 60 tablet 0  . predniSONE (DELTASONE) 20 MG tablet Take 2 tablets daily with breakfast. (Patient not taking: Reported on 12/24/2016) 10 tablet 0   No facility-administered medications prior to visit.     ROS Review of Systems  Constitutional: Negative for activity change and appetite change.  HENT: Negative for sinus pressure and sore throat.   Eyes: Negative for visual disturbance.  Respiratory: Negative for cough, chest tightness and shortness of breath.   Cardiovascular: Negative for chest pain and leg swelling.  Gastrointestinal:  Negative for abdominal distention, abdominal pain, constipation and diarrhea.  Endocrine: Negative.   Genitourinary: Negative for dysuria.  Musculoskeletal: Negative for joint swelling and myalgias.  Skin: Negative for rash.  Allergic/Immunologic: Negative.   Neurological: Negative for weakness, light-headedness and numbness.  Psychiatric/Behavioral: Negative for dysphoric mood and suicidal ideas.    Objective:  BP 136/76 (BP Location: Right Arm, Patient Position: Sitting, Cuff Size: Normal)   Pulse 76   Temp 98.2 F (36.8 C) (Oral)   Resp 18   Ht 5' 11.5" (1.816 m)   Wt 164 lb (74.4 kg)   SpO2 100%   BMI 22.55 kg/m   BP/Weight 02/18/2017 12/24/2016 69/48/5462  Systolic BP 703 500 938  Diastolic BP 76 82 80  Wt. (Lbs) 164 168.6 164  BMI 22.55 22.87 22.24      Physical Exam  Constitutional: He is oriented to person, place, and time. He appears well-developed and well-nourished.  Cardiovascular: Normal rate, normal heart sounds and intact distal pulses.   No murmur heard. Pulmonary/Chest: Effort normal and breath sounds normal. He has no wheezes. He has no rales. He exhibits no tenderness.  Abdominal: Soft. Bowel sounds are normal. He exhibits no distension and no mass. There is no tenderness.  Musculoskeletal: Normal range of motion.  Neurological: He is alert and oriented to person, place, and time.  Skin: Skin is warm and dry.  Psychiatric: He has a normal mood and affect.     CMP Latest Ref Rng & Units 06/04/2016  10/24/2015 11/29/2014  Glucose 65 - 99 mg/dL 84 88 88  BUN 7 - 25 mg/dL '13 14 10  '$ Creatinine 0.70 - 1.33 mg/dL 0.82 0.86 0.97  Sodium 135 - 146 mmol/L 138 140 139  Potassium 3.5 - 5.3 mmol/L 4.1 4.9 4.8  Chloride 98 - 110 mmol/L 105 106 103  CO2 20 - 31 mmol/L '27 27 27  '$ Calcium 8.6 - 10.3 mg/dL 9.0 9.2 9.8  Total Protein 6.1 - 8.1 g/dL 6.7 7.0 7.5  Total Bilirubin 0.2 - 1.2 mg/dL 0.3 0.4 0.5  Alkaline Phos 40 - 115 U/L 57 59 67  AST 10 - 35 U/L '16 18 20    '$ ALT 9 - 46 U/L 8(L) 10 10    Lipid Panel     Component Value Date/Time   CHOL 125 10/24/2015 1219   TRIG 38 10/24/2015 1219   HDL 41 10/24/2015 1219   CHOLHDL 3.0 10/24/2015 1219   VLDL 8 10/24/2015 1219   LDLCALC 76 10/24/2015 1219    Assessment & Plan:   1. Dyslipidemia Controlled Continue low-cholesterol diet - atorvastatin (LIPITOR) 40 MG tablet; Take 1 tablet (40 mg total) by mouth daily.  Dispense: 30 tablet; Refill: 6 - CMP14+EGFR; Future - Lipid panel; Future  2. Tobacco abuse Spent 3 minutes counseling on cessation and he is willing to commence nicotine gum  3. History of stroke No residual deficits  4. Need for hepatitis C screening test - Hepatitis c antibody (reflex); Future   Meds ordered this encounter  Medications  . atorvastatin (LIPITOR) 40 MG tablet    Sig: Take 1 tablet (40 mg total) by mouth daily.    Dispense:  30 tablet    Refill:  6    Must have office visit for refills    Follow-up: Return in about 6 months (around 08/20/2017) for Follow-up on chronic medical conditions.   Arnoldo Morale MD

## 2017-02-18 NOTE — Progress Notes (Signed)
Patient is here to reestablish care  Patient has taken medication today. Patient has eaten today.  Patient denies pain at this time.

## 2017-02-18 NOTE — Patient Instructions (Signed)
Steps to Quit Smoking Smoking tobacco can be bad for your health. It can also affect almost every organ in your body. Smoking puts you and people around you at risk for many serious long-lasting (chronic) diseases. Quitting smoking is hard, but it is one of the best things that you can do for your health. It is never too late to quit. What are the benefits of quitting smoking? When you quit smoking, you lower your risk for getting serious diseases and conditions. They can include:  Lung cancer or lung disease.  Heart disease.  Stroke.  Heart attack.  Not being able to have children (infertility).  Weak bones (osteoporosis) and broken bones (fractures).  If you have coughing, wheezing, and shortness of breath, those symptoms may get better when you quit. You may also get sick less often. If you are pregnant, quitting smoking can help to lower your chances of having a baby of low birth weight. What can I do to help me quit smoking? Talk with your doctor about what can help you quit smoking. Some things you can do (strategies) include:  Quitting smoking totally, instead of slowly cutting back how much you smoke over a period of time.  Going to in-person counseling. You are more likely to quit if you go to many counseling sessions.  Using resources and support systems, such as: ? Online chats with a counselor. ? Phone quitlines. ? Printed self-help materials. ? Support groups or group counseling. ? Text messaging programs. ? Mobile phone apps or applications.  Taking medicines. Some of these medicines may have nicotine in them. If you are pregnant or breastfeeding, do not take any medicines to quit smoking unless your doctor says it is okay. Talk with your doctor about counseling or other things that can help you.  Talk with your doctor about using more than one strategy at the same time, such as taking medicines while you are also going to in-person counseling. This can help make  quitting easier. What things can I do to make it easier to quit? Quitting smoking might feel very hard at first, but there is a lot that you can do to make it easier. Take these steps:  Talk to your family and friends. Ask them to support and encourage you.  Call phone quitlines, reach out to support groups, or work with a counselor.  Ask people who smoke to not smoke around you.  Avoid places that make you want (trigger) to smoke, such as: ? Bars. ? Parties. ? Smoke-break areas at work.  Spend time with people who do not smoke.  Lower the stress in your life. Stress can make you want to smoke. Try these things to help your stress: ? Getting regular exercise. ? Deep-breathing exercises. ? Yoga. ? Meditating. ? Doing a body scan. To do this, close your eyes, focus on one area of your body at a time from head to toe, and notice which parts of your body are tense. Try to relax the muscles in those areas.  Download or buy apps on your mobile phone or tablet that can help you stick to your quit plan. There are many free apps, such as QuitGuide from the CDC (Centers for Disease Control and Prevention). You can find more support from smokefree.gov and other websites.  This information is not intended to replace advice given to you by your health care provider. Make sure you discuss any questions you have with your health care provider. Document Released: 06/30/2009 Document   Revised: 05/01/2016 Document Reviewed: 01/18/2015 Elsevier Interactive Patient Education  2018 Elsevier Inc.  

## 2017-02-25 ENCOUNTER — Ambulatory Visit: Payer: BLUE CROSS/BLUE SHIELD | Attending: Family Medicine

## 2017-02-25 DIAGNOSIS — Z1159 Encounter for screening for other viral diseases: Secondary | ICD-10-CM

## 2017-02-25 DIAGNOSIS — E785 Hyperlipidemia, unspecified: Secondary | ICD-10-CM | POA: Diagnosis present

## 2017-02-25 NOTE — Progress Notes (Signed)
Patient here for lab visit only 

## 2017-02-26 LAB — CMP14+EGFR
A/G RATIO: 1.6 (ref 1.2–2.2)
ALK PHOS: 80 IU/L (ref 39–117)
ALT: 12 IU/L (ref 0–44)
AST: 21 IU/L (ref 0–40)
Albumin: 4.4 g/dL (ref 3.5–5.5)
BILIRUBIN TOTAL: 0.3 mg/dL (ref 0.0–1.2)
BUN/Creatinine Ratio: 16 (ref 9–20)
BUN: 17 mg/dL (ref 6–24)
CHLORIDE: 102 mmol/L (ref 96–106)
CO2: 26 mmol/L (ref 20–29)
Calcium: 9.4 mg/dL (ref 8.7–10.2)
Creatinine, Ser: 1.05 mg/dL (ref 0.76–1.27)
GFR calc Af Amer: 89 mL/min/{1.73_m2} (ref >59)
GFR calc non Af Amer: 77 mL/min/{1.73_m2} (ref >59)
GLUCOSE: 94 mg/dL (ref 65–99)
Globulin, Total: 2.8 g/dL (ref 1.5–4.5)
POTASSIUM: 4.7 mmol/L (ref 3.5–5.2)
Sodium: 142 mmol/L (ref 134–144)
TOTAL PROTEIN: 7.2 g/dL (ref 6.0–8.5)

## 2017-02-26 LAB — LIPID PANEL
CHOLESTEROL TOTAL: 127 mg/dL (ref 100–199)
Chol/HDL Ratio: 2.9 ratio (ref 0.0–5.0)
HDL: 44 mg/dL (ref >39)
LDL Calculated: 71 mg/dL (ref 0–99)
TRIGLYCERIDES: 62 mg/dL (ref 0–149)
VLDL CHOLESTEROL CAL: 12 mg/dL (ref 5–40)

## 2017-02-26 LAB — HCV COMMENT:

## 2017-02-26 LAB — HEPATITIS C ANTIBODY (REFLEX): HCV Ab: <0.1 s/co ratio (ref 0.0–0.9)

## 2017-06-18 ENCOUNTER — Ambulatory Visit: Payer: BLUE CROSS/BLUE SHIELD | Admitting: Physician Assistant

## 2017-06-24 ENCOUNTER — Encounter: Payer: Self-pay | Admitting: Physician Assistant

## 2017-06-24 ENCOUNTER — Ambulatory Visit (INDEPENDENT_AMBULATORY_CARE_PROVIDER_SITE_OTHER): Payer: BLUE CROSS/BLUE SHIELD | Admitting: Physician Assistant

## 2017-06-24 VITALS — BP 112/74 | HR 74 | Temp 97.4°F | Resp 16 | Ht 71.5 in | Wt 155.8 lb

## 2017-06-24 DIAGNOSIS — R21 Rash and other nonspecific skin eruption: Secondary | ICD-10-CM | POA: Diagnosis not present

## 2017-06-24 DIAGNOSIS — R350 Frequency of micturition: Secondary | ICD-10-CM | POA: Diagnosis not present

## 2017-06-24 DIAGNOSIS — A5900 Urogenital trichomoniasis, unspecified: Secondary | ICD-10-CM

## 2017-06-24 LAB — POCT URINALYSIS DIP (MANUAL ENTRY)
BILIRUBIN UA: NEGATIVE mg/dL
Bilirubin, UA: NEGATIVE
Glucose, UA: NEGATIVE mg/dL
Nitrite, UA: NEGATIVE
PH UA: 6 (ref 5.0–8.0)
Urobilinogen, UA: 2 E.U./dL — AB

## 2017-06-24 LAB — POC MICROSCOPIC URINALYSIS (UMFC)
Mucus: ABSENT
Trichomonas: POSITIVE

## 2017-06-24 MED ORDER — FLUCONAZOLE 150 MG PO TABS: ORAL_TABLET | ORAL | 0 refills | Status: DC

## 2017-06-24 NOTE — Progress Notes (Signed)
06/24/2017 9:05 AM   DOB: September 06, 1956 / MRN: [id number]  SUBJECTIVE:  Justin Odom is a 61 y.o. male presenting for a rash on the shaft of his penis that has been present for about two weeks.  It does not hurt or itch. He is sexually with his wife and now one else. He does associates increased frequency of urination.    He has No Known Allergies.   He  has a past medical history of CVA (cerebral infarction); Hyperlipidemia; and Stroke ([facility]) (11/25/2014).    He  reports that he has been smoking Cigarettes.  He has been smoking about 0.25 packs per day. He has never used smokeless tobacco. He reports that he drinks alcohol. He reports that he uses drugs, including Marijuana. He  has no sexual activity history on file. The patient  has no past surgical history on file.  His family history includes Cancer in his mother and sister; Pancreatic cancer in his mother.  Review of Systems  Constitutional: Negative for chills and fever.  Gastrointestinal: Negative for diarrhea.  Skin: Negative for itching and rash.  Neurological: Negative for dizziness.    The problem list and medications were reviewed and updated by myself where necessary and exist elsewhere in the encounter.   OBJECTIVE:  BP 112/74 (BP Location: Left Arm, Patient Position: Sitting, Cuff Size: Normal)   Pulse 74   Temp (!) 97.4 F (36.3 C) (Oral)   Resp 16   Ht 5' 11.5" (1.816 m)   Wt 155 lb 12.8 oz (70.7 kg)   SpO2 99%   BMI 21.43 kg/m   Physical Exam  Constitutional: He is active and cooperative.  Cardiovascular: Normal rate, regular rhythm, S1 normal, S2 normal, normal heart sounds, intact distal pulses and normal pulses.  Exam reveals no gallop and no friction rub.   No murmur heard. Pulmonary/Chest: Effort normal. No tachypnea. He has no rales.  Abdominal: He exhibits no distension.  Genitourinary:     Musculoskeletal: He exhibits no edema.  Neurological: He is alert.  Skin: Skin is warm.  Vitals  reviewed.   Results for orders placed or performed in visit on 06/24/17 (from the past 72 hour(s))  POCT urinalysis dipstick     Status: Abnormal   Collection Time: 06/24/17  9:02 AM  Result Value Ref Range   Color, UA yellow yellow   Clarity, UA cloudy (A) clear   Glucose, UA negative negative mg/dL   Bilirubin, UA negative negative   Ketones, POC UA negative negative mg/dL   Spec Grav, UA >=4.098 (A) 1.010 - 1.025   Blood, UA trace-intact (A) negative   pH, UA 6.0 5.0 - 8.0   Protein Ur, POC trace (A) negative mg/dL   Urobilinogen, UA 2.0 (A) 0.2 or 1.0 E.U./dL   Nitrite, UA Negative Negative   Leukocytes, UA Small (1+) (A) Negative    No results found.  ASSESSMENT AND PLAN:  Justin Odom was seen today for personal problem.  Diagnoses and all orders for this visit:  Penile rash: RTC in one month for recheck in rash is not completely resolved as penile cancer is not off of the differential.   -     fluconazole (DIFLUCAN) 150 MG tablet; Take 1 tab week.  Come back in one month if it does not get away.  Urine frequency -     POCT urinalysis dipstick    The patient is advised to call or return to clinic if he does not see an improvement  in symptoms, or to seek the care of the closest emergency department if he worsens with the above plan.   Deliah Boston, MHS, PA-C Primary [facility] at [facility] 06/24/2017 9:05 AM

## 2017-06-24 NOTE — Patient Instructions (Signed)
Come back in early January for a recheck.  Come back in 1 month if the rash is not better.

## 2017-06-26 LAB — GC/CHLAMYDIA PROBE AMP
Chlamydia trachomatis, NAA: NEGATIVE
NEISSERIA GONORRHOEAE BY PCR: NEGATIVE

## 2017-07-09 ENCOUNTER — Telehealth: Payer: Self-pay | Admitting: Family Medicine

## 2017-07-09 NOTE — Telephone Encounter (Signed)
Pt called to request a letter for his job inform them that he need a handicap parking space do to his medical condition, he was given the tag by Dr. Hyman HopesJegede. Please follow up

## 2017-07-10 NOTE — Telephone Encounter (Signed)
Will route to PCP 

## 2017-07-11 NOTE — Telephone Encounter (Signed)
His records do not reveal residual deficits that justify the need for a handicap form.

## 2017-08-29 IMAGING — DX DG CERVICAL SPINE COMPLETE 4+V
5 series · 5 of 5 positions shown · non-contrast
Comparison: None.

CLINICAL DATA: 59-year-old male with cervical neck pain and left
arm pain. Initial encounter.

EXAM:
CERVICAL SPINE - COMPLETE 4+ VIEW

[c-spine lat]
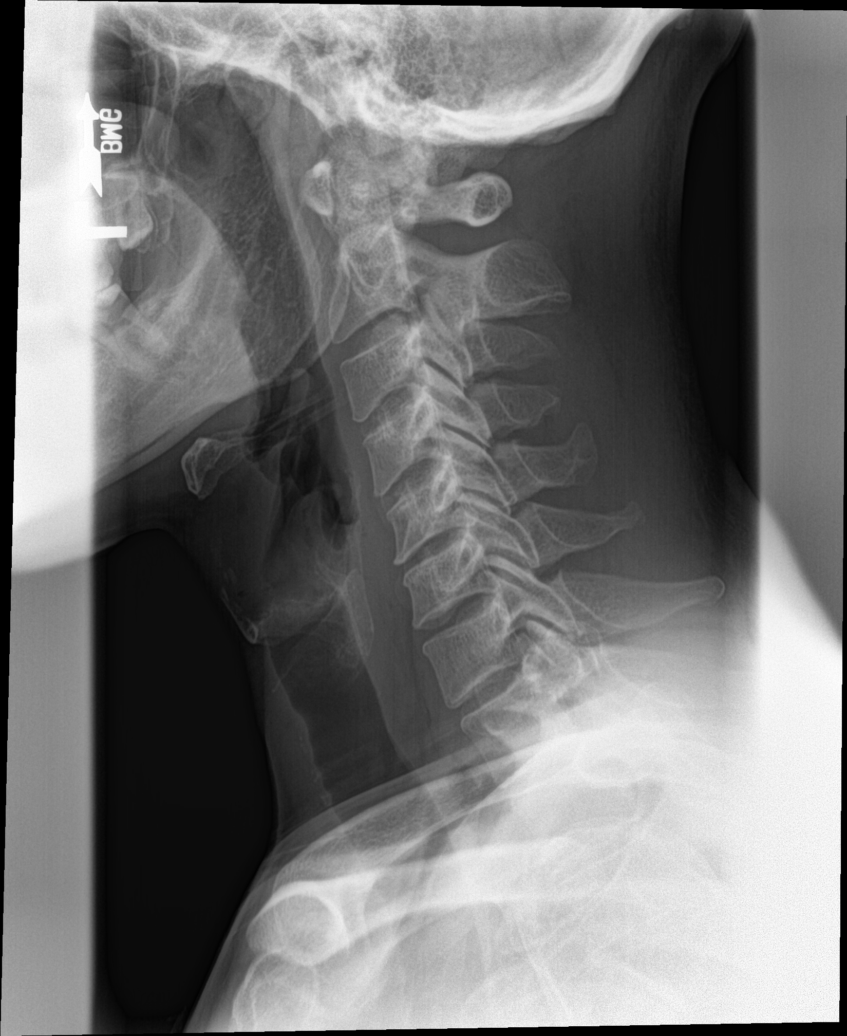

[c-spine obl (1 of 2)]
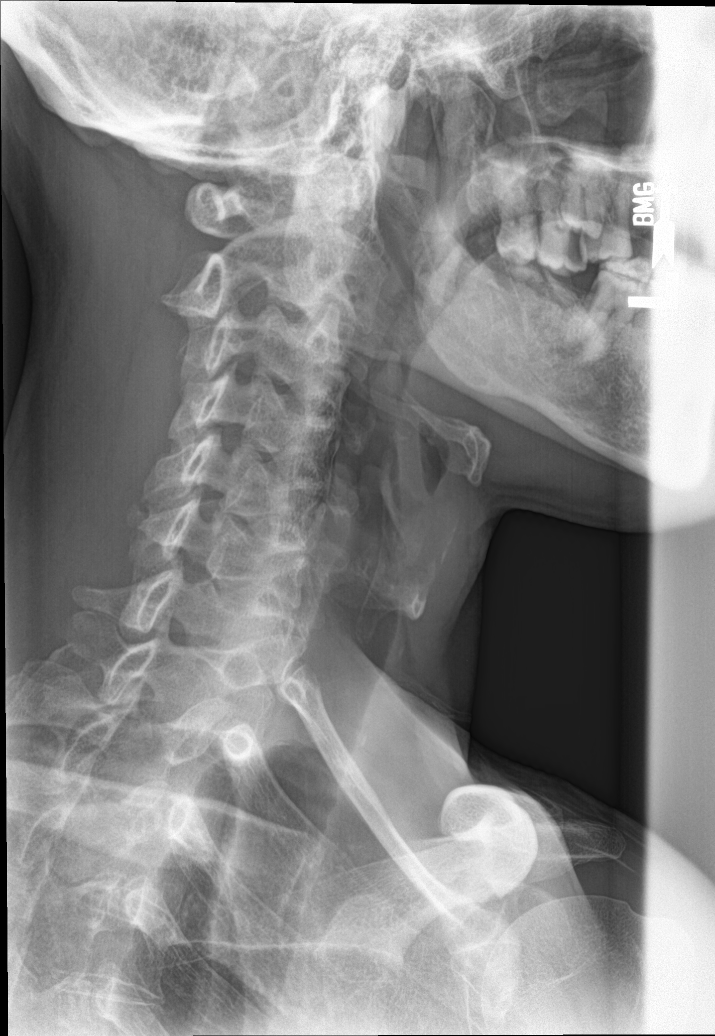

[c-spine obl (2 of 2)]
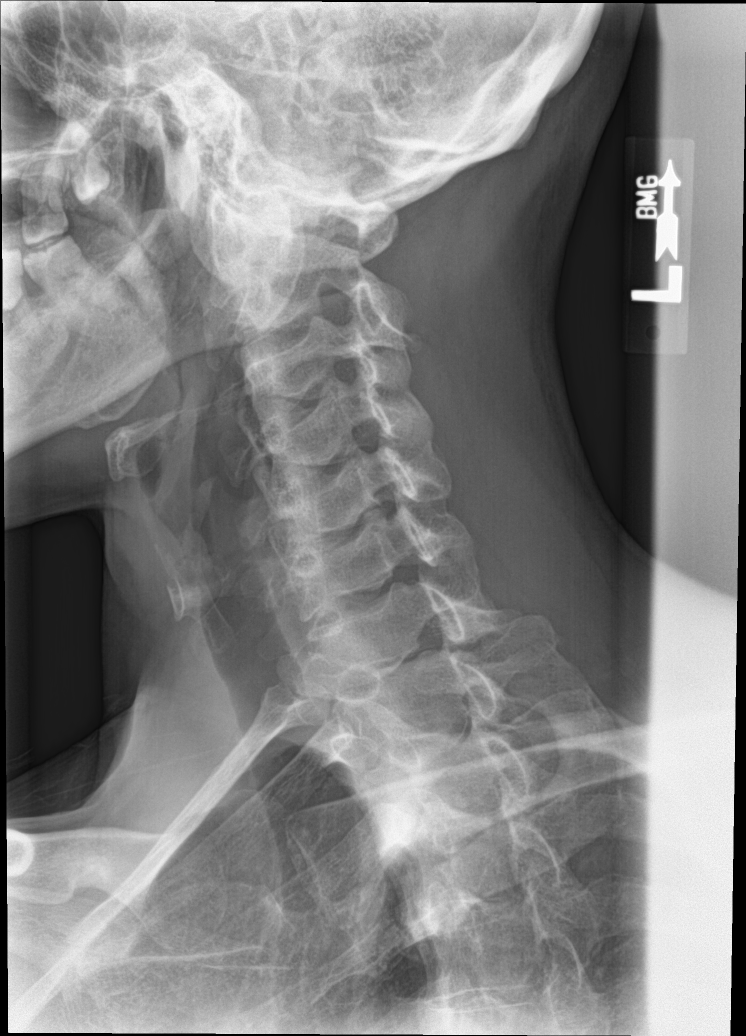

[c-spine ap]
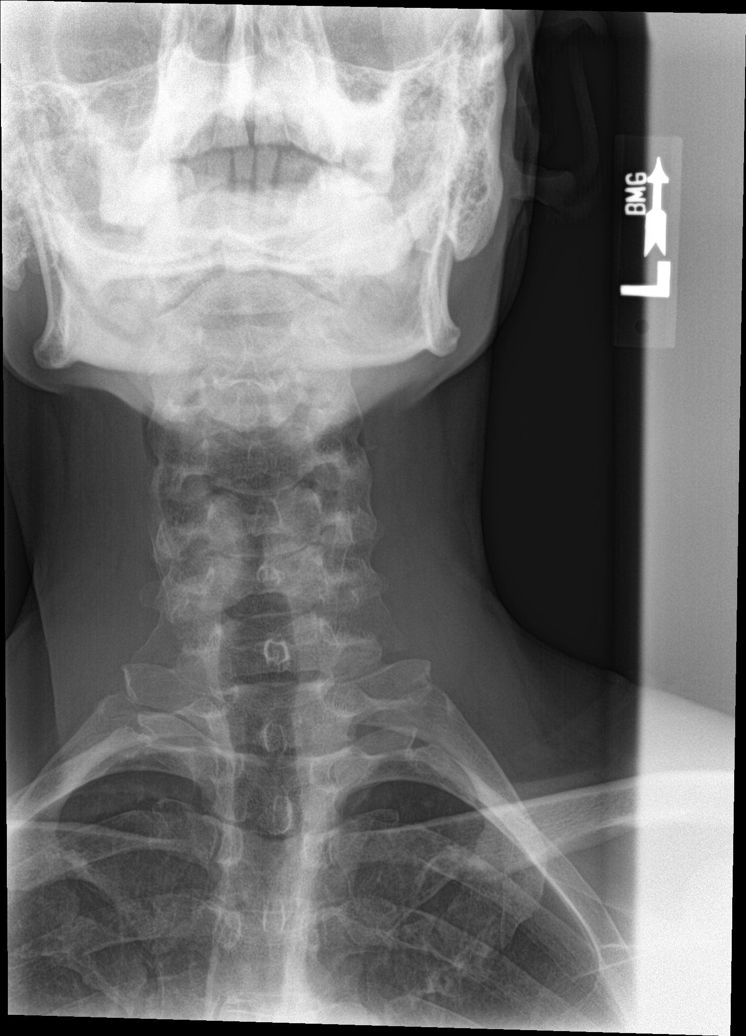

[c-spine open mouth]
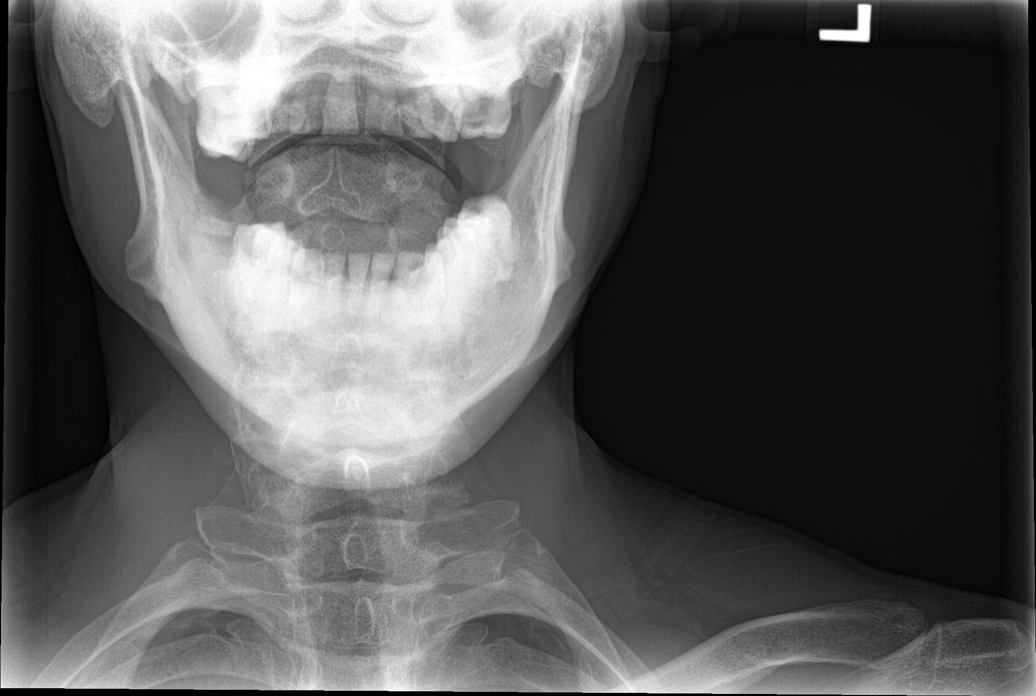

[5 of 5 positions shown; findings below may reference images not displayed]

FINDINGS: Straightening of cervical lordosis. Normal prevertebral soft tissue
contour. Bilateral posterior element alignment is within normal
limits. Cervical AP alignment within normal limits. Mild levoconvex
scoliotic curvature at the cervicothoracic junction. Normal C1-C2
alignment. Mild to moderate disc space loss with mild endplate
spurring at C5-C6. Negative lung apices.
IMPRESSION: 1.  No acute osseous abnormality in the cervical spine.
2. Chronic C5-C6 disc and endplate degeneration.

## 2017-10-01 ENCOUNTER — Other Ambulatory Visit: Payer: Self-pay | Admitting: Family Medicine

## 2017-10-01 DIAGNOSIS — E785 Hyperlipidemia, unspecified: Secondary | ICD-10-CM

## 2017-10-17 NOTE — Progress Notes (Signed)
Chief Complaint  Patient presents with  . scab and tenderness in left nostril    x 1 week, per pt it is not as tender as before but wanted to come in and make sure it's ok    HPI   Pt reports that when he picks his nose he feels a scab on the left side of his nostril No nosebleed No rhinorrhea He is a smoker He is taking aspirin   Past Medical History:  Diagnosis Date  . CVA (cerebral infarction)   . Hyperlipidemia   . Stroke [facility]) 11/25/2014    Current Outpatient Medications  Medication Sig Dispense Refill  . aspirin EC 325 MG tablet Take 1 tablet (325 mg total) by mouth daily. 90 tablet 3  . atorvastatin (LIPITOR) 40 MG tablet TAKE 1 TABLET BY MOUTH EVERY DAY 30 tablet 6  . Multiple Vitamins-Minerals (MULTIVITAMIN WITH MINERALS) tablet Take 1 tablet by mouth daily. Reported on 10/04/2015    . fluconazole (DIFLUCAN) 150 MG tablet Take 1 tab week.  Come back in one month if it does not get away. (Patient not taking: Reported on 10/21/2017) 4 tablet 0   No current facility-administered medications for this visit.     Allergies: No Known Allergies  No past surgical history on file.  Social History   Socioeconomic History  . Marital status: Divorced    Spouse name: None  . Number of children: None  . Years of education: None  . Highest education level: None  Social Needs  . Financial resource strain: None  . Food insecurity - worry: None  . Food insecurity - inability: None  . Transportation needs - medical: None  . Transportation needs - non-medical: None  Occupational History  . None  Tobacco Use  . Smoking status: Current Every Day Smoker    Packs/day: 0.25    Types: Cigarettes    Last attempt to quit: 12/21/2014    Years since quitting: 2.8  . Smokeless tobacco: Never Used  . Tobacco comment: 3 cigarettes a day  Substance and Sexual Activity  . Alcohol use: Yes    Alcohol/week: 0.0 oz    Comment: occ  . Drug use: Yes    Types: Marijuana  . Sexual  activity: None  Other Topics Concern  . None  Social History Narrative  . None    Family History  Problem Relation Age of Onset  . Cancer Mother   . Pancreatic cancer Mother   . Cancer Sister   . Colon cancer Neg Hx      ROS Review of Systems See HPI Constitution: No fevers or chills No malaise No diaphoresis Skin: No rash or itching Eyes: no blurry vision, no double vision GU: no dysuria or hematuria Neuro: no dizziness or headaches all others reviewed and negative   Objective: Vitals:   10/21/17 1339  BP: (!) 142/88  Pulse: 74  Resp: 16  Temp: 97.9 F (36.6 C)  TempSrc: Oral  SpO2: 100%  Weight: 162 lb 3.2 oz (73.6 kg)  Height: 5' 11.5" (1.816 m)    Physical Exam General: alert, oriented, in NAD Head: normocephalic, atraumatic, no sinus tenderness Eyes: EOM intact, no scleral icterus or conjunctival injection Ears: TM clear bilaterally Nose: mucosa nonerythematous, nonedematous, +visible scab in the left nare Throat: no pharyngeal exudate or erythema Lymph: no posterior auricular, submental or cervical lymph adenopathy Heart: normal rate, normal sinus rhythm, no murmurs Lungs: clear to auscultation bilaterally, no wheezing   Assessment and Plan Justin Odom  was seen today for scab and tenderness in left nostril.  Diagnoses and all orders for this visit:  Sore in nostril -  Visible scab in the left nostril  Advised to avoid pick   Zoe A Creta LevinStallings

## 2017-10-21 ENCOUNTER — Encounter: Payer: Self-pay | Admitting: Family Medicine

## 2017-10-21 ENCOUNTER — Ambulatory Visit: Payer: BLUE CROSS/BLUE SHIELD | Admitting: Family Medicine

## 2017-10-21 ENCOUNTER — Other Ambulatory Visit: Payer: Self-pay

## 2017-10-21 VITALS — BP 142/88 | HR 74 | Temp 97.9°F | Resp 16 | Ht 71.5 in | Wt 162.2 lb

## 2017-10-21 DIAGNOSIS — J3489 Other specified disorders of nose and nasal sinuses: Secondary | ICD-10-CM

## 2017-10-21 NOTE — Patient Instructions (Addendum)
  Do not pick your nose.  If you get nose bleeds use Afrin spray   IF you received an x-ray today, you will receive an invoice from [facility]. Please contact [facility] at [phone] with questions or concerns regarding your invoice.   IF you received labwork today, you will receive an invoice from [facility]. Please contact [facility] at [phone] with questions or concerns regarding your invoice.   Our billing staff will not be able to assist you with questions regarding bills from these companies.  You will be contacted with the lab results as soon as they are available. The fastest way to get your results is to activate your My Chart account. Instructions are located on the last page of this paperwork. If you have not heard from us regarding the results in 2 weeks, please contact this office.

## 2017-11-11 ENCOUNTER — Encounter: Payer: Self-pay | Admitting: Family Medicine

## 2017-11-11 ENCOUNTER — Other Ambulatory Visit: Payer: Self-pay

## 2017-11-11 ENCOUNTER — Ambulatory Visit: Payer: BLUE CROSS/BLUE SHIELD | Admitting: Family Medicine

## 2017-11-11 VITALS — BP 133/75 | HR 50 | Temp 97.9°F | Resp 16 | Ht 71.5 in | Wt 165.0 lb

## 2017-11-11 DIAGNOSIS — L989 Disorder of the skin and subcutaneous tissue, unspecified: Secondary | ICD-10-CM | POA: Diagnosis not present

## 2017-11-11 NOTE — Progress Notes (Signed)
Chief Complaint  Patient presents with  . sore back    onset: bump on right side of back.  Bump is not hard.  Friday night slept in chair pain but then it went away    HPI  4 review of systems  Past Medical History:  Diagnosis Date  . CVA (cerebral infarction)   . Hyperlipidemia   . Stroke [facility]) 11/25/2014    Current Outpatient Medications  Medication Sig Dispense Refill  . aspirin EC 325 MG tablet Take 1 tablet (325 mg total) by mouth daily. 90 tablet 3  . atorvastatin (LIPITOR) 40 MG tablet TAKE 1 TABLET BY MOUTH EVERY DAY 30 tablet 6  . Multiple Vitamins-Minerals (MULTIVITAMIN WITH MINERALS) tablet Take 1 tablet by mouth daily. Reported on 10/04/2015     No current facility-administered medications for this visit.     Allergies: No Known Allergies  No past surgical history on file.  Social History   Socioeconomic History  . Marital status: Divorced    Spouse name: None  . Number of children: None  . Years of education: None  . Highest education level: None  Social Needs  . Financial resource strain: None  . Food insecurity - worry: None  . Food insecurity - inability: None  . Transportation needs - medical: None  . Transportation needs - non-medical: None  Occupational History  . None  Tobacco Use  . Smoking status: Current Every Day Smoker    Packs/day: 0.25    Types: Cigarettes    Last attempt to quit: 12/21/2014    Years since quitting: 2.8  . Smokeless tobacco: Never Used  . Tobacco comment: 3 cigarettes a day  Substance and Sexual Activity  . Alcohol use: Yes    Alcohol/week: 0.0 oz    Comment: occ  . Drug use: Yes    Types: Marijuana  . Sexual activity: None  Other Topics Concern  . None  Social History Narrative  . None    Family History  Problem Relation Age of Onset  . Cancer Mother   . Pancreatic cancer Mother   . Cancer Sister   . Colon cancer Neg Hx      ROS Review of Systems See HPI Constitution: No fevers or chills No  malaise No diaphoresis Skin: No rash or itching Eyes: no blurry vision, no double vision GU: no dysuria or hematuria Neuro: no dizziness or headaches * all others reviewed and negative   Objective: Vitals:   11/11/17 1630  BP: 133/75  Pulse: (!) 50  Resp: 16  Temp: 97.9 F (36.6 C)  TempSrc: Oral  SpO2: 98%  Weight: 165 lb (74.8 kg)  Height: 5' 11.5" (1.816 m)    Physical Exam  Constitutional: He is oriented to person, place, and time. He appears well-developed and well-nourished.  HENT:  Head: Normocephalic and atraumatic.  Pulmonary/Chest: Effort normal.  Musculoskeletal:       Back:  Neurological: He is alert and oriented to person, place, and time.   Oblong shaped lesion soft fluid filled in a clearly demarcated sac nontender Seems to extend from the edge of the scapula to the shoulder joint  Assessment and Plan Langdon was seen today for sore back.  Diagnoses and all orders for this visit:  Skin lesion of back- since this office does not have ultrasound and the lesion is so big would recommend general surgery so that the capsule can be removed -     Ambulatory referral to General Surgery  Other  orders -     Cancel: Tdap vaccine greater than or equal to 7yo IM     Maude Hettich A [employer]oughStallings

## 2017-11-11 NOTE — Patient Instructions (Signed)
     IF you received an x-ray today, you will receive an invoice from [facility]. Please contact [facility] at [phone] with questions or concerns regarding your invoice.   IF you received labwork today, you will receive an invoice from [facility]. Please contact [facility] at [phone] with questions or concerns regarding your invoice.   Our billing staff will not be able to assist you with questions regarding bills from these companies.  You will be contacted with the lab results as soon as they are available. The fastest way to get your results is to activate your My Chart account. Instructions are located on the last page of this paperwork. If you have not heard from us regarding the results in 2 weeks, please contact this office.     

## 2017-11-26 ENCOUNTER — Other Ambulatory Visit: Payer: Self-pay | Admitting: Surgery

## 2017-12-27 ENCOUNTER — Ambulatory Visit: Payer: BLUE CROSS/BLUE SHIELD

## 2018-09-03 ENCOUNTER — Other Ambulatory Visit: Payer: Self-pay

## 2018-09-03 ENCOUNTER — Encounter: Payer: Self-pay | Admitting: Family Medicine

## 2018-09-03 ENCOUNTER — Ambulatory Visit: Payer: BLUE CROSS/BLUE SHIELD | Admitting: Family Medicine

## 2018-09-03 VITALS — BP 140/78 | HR 89 | Temp 98.4°F | Resp 16 | Ht 71.0 in | Wt 158.6 lb

## 2018-09-03 DIAGNOSIS — Z23 Encounter for immunization: Secondary | ICD-10-CM

## 2018-09-03 DIAGNOSIS — J069 Acute upper respiratory infection, unspecified: Secondary | ICD-10-CM

## 2018-09-03 MED ORDER — IPRATROPIUM BROMIDE 0.03 % NA SOLN: 2.0000 | Freq: Two times a day (BID) | NASAL | 0 refills | Status: DC

## 2018-09-03 MED ORDER — BENZONATATE 100 MG PO CAPS: 100.0000 mg | ORAL_CAPSULE | Freq: Three times a day (TID) | ORAL | 0 refills | Status: DC | PRN

## 2018-09-03 NOTE — Progress Notes (Signed)
12/18/20194:32 PM  Justin Odom 05-25-1956, 62 y.o. male [id number]  Chief Complaint  Patient presents with  . URI    coughing, running nose, chest congestion, wheezing x2 days    HPI:   Patient is a 63 y.o. male with past medical history significant for HLP and CVA who presents today for cough, congestion and wheezing  Having nasal congestion, feels head is all pressured, runny nose Has felt himself wheeze but no SOB, able to sing Denies any fever, but has had some chills No body aches, ear pain or sinus pain, no sore throat Has been doing OTC meds Has not had flu vaccine this season Has taken days off from work + smoker, denies h/o COPD  Fall Risk  09/03/2018 11/11/2017 10/21/2017 06/24/2017 02/18/2017  Falls in the past year? 0 No No No No     Depression screen [facility] 2/9 09/03/2018 11/11/2017 10/21/2017  Decreased Interest 0 0 0  Down, Depressed, Hopeless 0 0 0  PHQ - 2 Score 0 0 0    No Known Allergies  Prior to Admission medications   Medication Sig Start Date End Date Taking? Authorizing Provider  atorvastatin (LIPITOR) 40 MG tablet TAKE 1 TABLET BY MOUTH EVERY DAY Patient not taking: Reported on 09/03/2018 10/03/17   Hoy Register, MD    Past Medical History:  Diagnosis Date  . CVA (cerebral infarction)   . Hyperlipidemia   . Stroke (HCC) 11/25/2014    No past surgical history on file.  Social History   Tobacco Use  . Smoking status: Current Every Day Smoker    Packs/day: 0.25    Types: Cigarettes    Last attempt to quit: 12/21/2014    Years since quitting: 3.7  . Smokeless tobacco: Never Used  . Tobacco comment: 3 cigarettes a day  Substance Use Topics  . Alcohol use: Yes    Alcohol/week: 0.0 standard drinks    Comment: occ    Family History  Problem Relation Age of Onset  . Cancer Mother   . Pancreatic cancer Mother   . Cancer Sister   . Colon cancer Neg Hx     ROS Per hpi  OBJECTIVE:  Blood pressure 140/78, pulse 89, temperature 98.4 F  (36.9 C), temperature source Oral, resp. rate 16, height 5\' 11"  (1.803 m), weight 158 lb 9.6 oz (71.9 kg), SpO2 99 %. Body mass index is 22.12 kg/m.   Physical Exam Vitals signs and nursing note reviewed.  Constitutional:      Appearance: He is well-developed.  HENT:     Head: Normocephalic and atraumatic.     Right Ear: Hearing, tympanic membrane, ear canal and external ear normal.     Left Ear: Hearing, tympanic membrane, ear canal and external ear normal.     Nose: Congestion and rhinorrhea present.     Mouth/Throat:     Pharynx: No oropharyngeal exudate.  Eyes:     Conjunctiva/sclera: Conjunctivae normal.     Pupils: Pupils are equal, round, and reactive to light.  Neck:     Musculoskeletal: Neck supple.  Cardiovascular:     Rate and Rhythm: Normal rate and regular rhythm.     Heart sounds: No murmur. No friction rub. No gallop.   Pulmonary:     Effort: Pulmonary effort is normal.     Breath sounds: Normal breath sounds. No wheezing or rales.  Lymphadenopathy:     Cervical: No cervical adenopathy.  Skin:    General: Skin is warm and dry.  Neurological:     Mental Status: He is alert and oriented to person, place, and time.      ASSESSMENT and PLAN  1. Acute upper respiratory infection Discussed supportive measures for URI: increase hydration, rest, OTC medications, etc. RTC precautions discussed. Other orders - ipratropium (ATROVENT) 0.03 % nasal spray; Place 2 sprays into both nostrils 2 (two) times daily. - benzonatate (TESSALON) 100 MG capsule; Take 1-2 capsules (100-200 mg total) by mouth 3 (three) times daily as needed for cough. - Flu Vaccine QUAD 36+ mos IM  Return if symptoms worsen or fail to improve.    Myles LippsIrma M Santiago, MD [facility] [address] [place] [address] [place], [place] [address] Ph.  [phone] Fax [phone]

## 2018-09-03 NOTE — Patient Instructions (Addendum)
   If you have lab work done today you will be contacted with your lab results within the next 2 weeks.  If you have not heard from us then please contact us. The fastest way to get your results is to register for My Chart.   IF you received an x-ray today, you will receive an invoice from [facility]. Please contact [facility] at [phone] with questions or concerns regarding your invoice.   IF you received labwork today, you will receive an invoice from [facility]. Please contact [facility] at [phone] with questions or concerns regarding your invoice.   Our billing staff will not be able to assist you with questions regarding bills from these companies.  You will be contacted with the lab results as soon as they are available. The fastest way to get your results is to activate your My Chart account. Instructions are located on the last page of this paperwork. If you have not heard from us regarding the results in 2 weeks, please contact this office.     Upper Respiratory Infection, Adult An upper respiratory infection (URI) affects the nose, throat, and upper air passages. URIs are caused by germs (viruses). The most common type of URI is often called "the common cold." Medicines cannot cure URIs, but you can do things at home to relieve your symptoms. URIs usually get better within 7-10 days. Follow these instructions at home: Activity  Rest as needed.  If you have a fever, stay home from work or school until your fever is gone, or until your doctor says you may return to work or school. ? You should stay home until you cannot spread the infection anymore (you are not contagious). ? Your doctor may have you wear a face mask so you have less risk of spreading the infection. Relieving symptoms  Gargle with a salt-water mixture 3-4 times a day or as needed. To make a salt-water mixture, completely dissolve -1 tsp of salt in 1 cup of warm water.  Use a  cool-mist humidifier to add moisture to the air. This can help you breathe more easily. Eating and drinking   Drink enough fluid to keep your pee (urine) pale yellow.  Eat soups and other clear broths. General instructions   Take over-the-counter and prescription medicines only as told by your doctor. These include cold medicines, fever reducers, and cough suppressants.  Do not use any products that contain nicotine or tobacco. These include cigarettes and e-cigarettes. If you need help quitting, ask your doctor.  Avoid being where people are smoking (avoid secondhand smoke).  Make sure you get regular shots and get the flu shot every year.  Keep all follow-up visits as told by your doctor. This is important. How to avoid spreading infection to others   Wash your hands often with soap and water. If you do not have soap and water, use hand sanitizer.  Avoid touching your mouth, face, eyes, or nose.  Cough or sneeze into a tissue or your sleeve or elbow. Do not cough or sneeze into your hand or into the air. Contact a doctor if:  You are getting worse, not better.  You have any of these: ? A fever. ? Chills. ? Brown or red mucus in your nose. ? Yellow or brown fluid (discharge)coming from your nose. ? Pain in your face, especially when you bend forward. ? Swollen neck glands. ? Pain with swallowing. ? White areas in the back of your throat. Get help   right away if:  You have shortness of breath that gets worse.  You have very bad or constant: ? Headache. ? Ear pain. ? Pain in your forehead, behind your eyes, and over your cheekbones (sinus pain). ? Chest pain.  You have long-lasting (chronic) lung disease along with any of these: ? Wheezing. ? Long-lasting cough. ? Coughing up blood. ? A change in your usual mucus.  You have a stiff neck.  You have changes in your: ? Vision. ? Hearing. ? Thinking. ? Mood. Summary  An upper respiratory infection (URI) is  caused by a germ called a virus. The most common type of URI is often called "the common cold."  URIs usually get better within 7-10 days.  Take over-the-counter and prescription medicines only as told by your doctor. This information is not intended to replace advice given to you by your health care provider. Make sure you discuss any questions you have with your health care provider. Document Released: 02/20/2008 Document Revised: 04/26/2017 Document Reviewed: 04/26/2017 Elsevier Interactive Patient Education  2019 Elsevier Inc.  

## 2018-12-22 ENCOUNTER — Other Ambulatory Visit: Payer: Self-pay

## 2018-12-22 ENCOUNTER — Ambulatory Visit: Payer: BLUE CROSS/BLUE SHIELD | Admitting: Emergency Medicine

## 2019-02-02 DIAGNOSIS — I10 Essential (primary) hypertension: Secondary | ICD-10-CM

## 2019-02-02 HISTORY — DX: Essential (primary) hypertension: I10

## 2019-06-17 ENCOUNTER — Other Ambulatory Visit: Payer: Self-pay

## 2019-06-17 DIAGNOSIS — Z20822 Contact with and (suspected) exposure to covid-19: Secondary | ICD-10-CM

## 2019-06-18 LAB — NOVEL CORONAVIRUS, NAA: SARS-CoV-2, NAA: NOT DETECTED

## 2019-06-24 ENCOUNTER — Other Ambulatory Visit: Payer: Self-pay

## 2019-06-24 DIAGNOSIS — Z20822 Contact with and (suspected) exposure to covid-19: Secondary | ICD-10-CM

## 2019-06-25 LAB — NOVEL CORONAVIRUS, NAA: SARS-CoV-2, NAA: DETECTED — AB

## 2019-06-26 ENCOUNTER — Ambulatory Visit: Payer: Self-pay | Admitting: *Deleted

## 2019-06-26 NOTE — Telephone Encounter (Signed)
   Reason for Disposition . Caller has medication question only, adult not sick, and triager answers question  Answer Assessment - Initial Assessment Questions 1.   NAME of MEDICATION: "What medicine are you calling about?"     My COVID-19 test is positive.   The nurse I talked to this morning told me to take Tylenol but didn't say how much or how often. 2.   QUESTION: "What is your question?"     Tylenol 3.   PRESCRIBING HCP: "Who prescribed it?" Reason: if prescribed by specialist, call should be referred to that group.     The nurse that called me this morning about my COVID-19 result. 4. SYMPTOMS: "Do you have any symptoms?"     No fever, headache or body aches or chills. 5. SEVERITY: If symptoms are present, ask "Are they mild, moderate or severe?"     None 6.  PREGNANCY:  "Is there any chance that you are pregnant?" "When was your last menstrual period?"     N/A I let him know the Tylenol was for if he had a headache, fever, body aches or bone pain with the virus.   He could take 2 Tylenol every 4-6 hours as needed for those symptoms. He verbalized understanding and thanked me for my help.  Protocols used: MEDICATION QUESTION CALL-A-AH

## 2019-07-02 ENCOUNTER — Other Ambulatory Visit: Payer: Self-pay

## 2019-07-02 DIAGNOSIS — Z20822 Contact with and (suspected) exposure to covid-19: Secondary | ICD-10-CM

## 2019-07-04 LAB — NOVEL CORONAVIRUS, NAA: SARS-CoV-2, NAA: DETECTED — AB

## 2019-07-06 ENCOUNTER — Telehealth: Payer: Self-pay

## 2019-07-06 ENCOUNTER — Telehealth: Payer: Self-pay | Admitting: Family Medicine

## 2019-07-06 NOTE — Telephone Encounter (Signed)
[facility] Dept. Was notified.

## 2019-07-06 NOTE — Telephone Encounter (Signed)
07/06/2019 - PATIENT CALLED BACK AGAIN (Monday 07/06/2019) ABOUT 2:30pm SAYING HE MISSED A CALL FROM [facility]IMARY [facility] AT [place]. [place]

## 2019-07-06 NOTE — Telephone Encounter (Signed)
Pt missed ca;l from this number fr

## 2019-07-06 NOTE — Telephone Encounter (Signed)
Patient called stating that he had heard from Dr Pamella Pert that he has another positive COVID-19 test 4 days ago. He needed to know how long he had to wait for his next test. He states he needs to return to work. Symptoms tier  and criteria for ending isolation  was reviewed.  Good preventative practices were discussed. Patient was made aware that he does not need tested again unless his symptoms continue and that the new policy was 90 days post positive result. He is aware that he can request from his PCP for earlier test but was advised to at least wait the to day criteria. Patient denies symptoms.  He verbalized understanding of all information.

## 2019-07-08 ENCOUNTER — Telehealth: Payer: Self-pay | Admitting: Family Medicine

## 2019-07-08 NOTE — Telephone Encounter (Signed)
Spoke with pt and gave clarification about his results and quarantine process. He verbalized understanding and will give us a call after his 10 days are over and he has been re tested.  At that time we will provide a letter for him to return to work.  

## 2019-07-08 NOTE — Telephone Encounter (Signed)
Copied from [place] [id number]. Topic: General - Other >> Jul 08, 2019  8:42 AM Carolyn Stare wrote: Pt ask that his doctor call him said he has some questions

## 2019-07-08 NOTE — Telephone Encounter (Signed)
Spoke with pt and gave clarification about his results and quarantine process. He verbalized understanding and will give [place] a call after his 10 days are over and he has been re tested.  At that time we will provide a letter for him to return to work.

## 2019-07-13 ENCOUNTER — Other Ambulatory Visit: Payer: Self-pay

## 2019-07-13 DIAGNOSIS — Z20822 Contact with and (suspected) exposure to covid-19: Secondary | ICD-10-CM

## 2019-07-15 LAB — NOVEL CORONAVIRUS, NAA: SARS-CoV-2, NAA: NOT DETECTED

## 2019-07-20 ENCOUNTER — Telehealth: Payer: Self-pay

## 2019-07-20 NOTE — Telephone Encounter (Signed)
Pt. Given negative COVID  19 results. Verbalizes understanding. 

## 2019-08-21 ENCOUNTER — Telehealth: Payer: Self-pay | Admitting: Family Medicine

## 2019-09-28 ENCOUNTER — Encounter: Payer: Self-pay | Admitting: Family Medicine

## 2019-09-28 ENCOUNTER — Ambulatory Visit: Payer: BLUE CROSS/BLUE SHIELD | Admitting: Cardiology

## 2019-09-28 ENCOUNTER — Ambulatory Visit (INDEPENDENT_AMBULATORY_CARE_PROVIDER_SITE_OTHER): Payer: 59 | Admitting: Family Medicine

## 2019-09-28 ENCOUNTER — Other Ambulatory Visit: Payer: Self-pay

## 2019-09-28 VITALS — BP 120/80 | HR 66 | Temp 98.3°F | Ht 71.0 in | Wt 162.2 lb

## 2019-09-28 DIAGNOSIS — Z8673 Personal history of transient ischemic attack (TIA), and cerebral infarction without residual deficits: Secondary | ICD-10-CM

## 2019-09-28 DIAGNOSIS — I4940 Unspecified premature depolarization: Secondary | ICD-10-CM

## 2019-09-28 DIAGNOSIS — E785 Hyperlipidemia, unspecified: Secondary | ICD-10-CM

## 2019-09-28 DIAGNOSIS — I10 Essential (primary) hypertension: Secondary | ICD-10-CM

## 2019-09-28 HISTORY — DX: Unspecified premature depolarization: I49.40

## 2019-09-28 MED ORDER — METOPROLOL SUCCINATE ER 50 MG PO TB24: 50.0000 mg | ORAL_TABLET | Freq: Every day | ORAL | 3 refills | Status: DC

## 2019-09-28 MED ORDER — ATORVASTATIN CALCIUM 40 MG PO TABS: 40.0000 mg | ORAL_TABLET | Freq: Every day | ORAL | 3 refills | Status: DC

## 2019-09-28 NOTE — Patient Instructions (Signed)
° ° ° °  If you have lab work done today you will be contacted with your lab results within the next 2 weeks.  If you have not heard from us then please contact us. The fastest way to get your results is to register for My Chart. ° ° °IF you received an x-ray today, you will receive an invoice from [facility]. Please contact [facility] at [phone] with questions or concerns regarding your invoice.  ° °IF you received labwork today, you will receive an invoice from [facility]. Please contact [facility] at [phone] with questions or concerns regarding your invoice.  ° °Our billing staff will not be able to assist you with questions regarding bills from these companies. ° °You will be contacted with the lab results as soon as they are available. The fastest way to get your results is to activate your My Chart account. Instructions are located on the last page of this paperwork. If you have not heard from us regarding the results in 2 weeks, please contact this office. °  ° ° ° °

## 2019-09-28 NOTE — Progress Notes (Signed)
1/11/202110:53 AM  Justin Odom 1956-03-08, 64 y.o., male [id number]  No chief complaint on file.   HPI:   Patient is a 64 y.o. male with past medical history significant for HTN, h/o CVA, HLP, tobacco use who presents today to establish care   Previously seeing [facility] - had to change due to insurance Seen at [facility] Aug 20 2019 for premature heart beats, h/o CVA, HTN, HLP and heart murmur - started on ASA 81mg , advised smoking cessation, started on metoprolol XL 25mg  daily, referred to cards - appt on Jan 18th Normal CMP LDL 166 - increased atorvastatin to 40mg  Saw them again on Sep 16 2019 - increased metoprolol to 50mg  daily  Patient reports TIA in 2016 Patient denies any palpitations He has been tolerating new meds/doses well He has a BP cuff at home but the cuff is too large He has not smoke since Friday, was smoking 4 cig a day  Depression screen [facility] 2/9 09/03/2018 11/11/2017 10/21/2017  Decreased Interest 0 0 0  Down, Depressed, Hopeless 0 0 0  PHQ - 2 Score 0 0 0    Fall Risk  09/03/2018 11/11/2017 10/21/2017 06/24/2017 02/18/2017  Falls in the past year? 0 No No No No     No Known Allergies  Prior to Admission medications   Medication Sig Start Date End Date Taking? Authorizing Provider  atorvastatin (LIPITOR) 40 MG tablet TAKE 1 TABLET BY MOUTH EVERY DAY Patient not taking: Reported on 09/03/2018 10/03/17   Charlott Rakes, MD  benzonatate (TESSALON) 100 MG capsule Take 1-2 capsules (100-200 mg total) by mouth 3 (three) times daily as needed for cough. 09/03/18   Rutherford Guys, MD  ipratropium (ATROVENT) 0.03 % nasal spray Place 2 sprays into both nostrils 2 (two) times daily. 09/03/18   Rutherford Guys, MD    Past Medical History:  Diagnosis Date  . CVA (cerebral infarction)   . Hyperlipidemia   . Stroke ([facility]) 11/25/2014    No past surgical history on file.  Social History   Tobacco Use  . Smoking status: Current Every Day Smoker    Packs/day: 0.25   Types: Cigarettes    Last attempt to quit: 12/21/2014    Years since quitting: 4.7  . Smokeless tobacco: Never Used  . Tobacco comment: 3 cigarettes a day  Substance Use Topics  . Alcohol use: Yes    Alcohol/week: 0.0 standard drinks    Comment: occ    Family History  Problem Relation Age of Onset  . Cancer Mother   . Pancreatic cancer Mother   . Cancer Sister   . Colon cancer Neg Hx     Review of Systems  Constitutional: Negative for chills and fever.  Respiratory: Negative for cough and shortness of breath.   Cardiovascular: Negative for chest pain, palpitations and leg swelling.  Gastrointestinal: Negative for abdominal pain, nausea and vomiting.     OBJECTIVE:  Today's Vitals   09/28/19 1054  BP: 120/80  Pulse: 66  Temp: 98.3 F (36.8 C)  SpO2: 99%  Weight: 162 lb 3.2 oz (73.6 kg)  Height: 5\' 11"  (1.803 m)   Body mass index is 22.62 kg/m.   Physical Exam Vitals and nursing note reviewed.  Constitutional:      Appearance: He is well-developed.  HENT:     Head: Normocephalic and atraumatic.  Eyes:     Conjunctiva/sclera: Conjunctivae normal.     Pupils: Pupils are equal, round, and reactive to light.  Cardiovascular:  Rate and Rhythm: Normal rate and regular rhythm.     Heart sounds: No murmur. No friction rub. No gallop.   Pulmonary:     Effort: Pulmonary effort is normal.     Breath sounds: Normal breath sounds. No wheezing, rhonchi or rales.  Musculoskeletal:     Cervical back: Neck supple.     Right lower leg: No edema.     Left lower leg: No edema.  Skin:    General: Skin is warm and dry.  Neurological:     Mental Status: He is alert and oriented to person, place, and time.    My interpretation of EKG:  NSR,HR 63, qwaves v1-v2, non specific t wave changes. Changed compared to 2012  No results found for this or any previous visit (from the past 24 hour(s)).  No results found.   ASSESSMENT and PLAN  1. Essential hypertension,  benign Controlled. Continue current regime.  - repeat BP  2. Dyslipidemia Recent increase in dose. Recheck labs at next OV - atorvastatin (LIPITOR) 40 MG tablet; Take 1 tablet (40 mg total) by mouth daily.  3. H/O TIA (transient ischemic attack) and stroke Discussed RF modifications, cont ASA 81mg .   4. Premature heartbeats - EKG 12-Lead  Other orders - metoprolol succinate (TOPROL-XL) 50 MG 24 hr tablet; Take 1 tablet (50 mg total) by mouth daily. Take with or immediately following a meal.  Return in about 3 months (around 12/27/2019).     02/26/2020, MD Primary Care at [facility] [address] [place], [place] Ph.  [phone] Fax [phone]

## 2019-10-05 ENCOUNTER — Encounter: Payer: Self-pay | Admitting: Cardiology

## 2019-10-05 ENCOUNTER — Other Ambulatory Visit: Payer: Self-pay

## 2019-10-05 ENCOUNTER — Ambulatory Visit (INDEPENDENT_AMBULATORY_CARE_PROVIDER_SITE_OTHER): Payer: 59 | Admitting: Cardiology

## 2019-10-05 VITALS — BP 158/86 | HR 82 | Ht 71.0 in | Wt 165.0 lb

## 2019-10-05 DIAGNOSIS — I10 Essential (primary) hypertension: Secondary | ICD-10-CM

## 2019-10-05 DIAGNOSIS — E785 Hyperlipidemia, unspecified: Secondary | ICD-10-CM | POA: Insufficient documentation

## 2019-10-05 DIAGNOSIS — E782 Mixed hyperlipidemia: Secondary | ICD-10-CM

## 2019-10-05 NOTE — Patient Instructions (Signed)
Medication Instructions:  Your physician recommends that you continue on your current medications as directed. Please refer to the Current Medication list given to you today.  *If you need a refill on your cardiac medications before your next appointment, please call your pharmacy*  Lab Work: NONE If you have labs (blood work) drawn today and your tests are completely normal, you will receive your results only by: Marland Kitchen MyChart Message (if you have MyChart) OR . A paper copy in the mail If you have any lab test that is abnormal or we need to change your treatment, we will call you to review the results.  Testing/Procedures: Your physician has requested that you have an echocardiogram. Echocardiography is a painless test that uses sound waves to create images of your heart. It provides your doctor with information about the size and shape of your heart and how well your heart's chambers and valves are working. This procedure takes approximately one hour. There are no restrictions for this procedure.  Your physician has requested that you have a CT calcium score performed. You are scheduled for 11/02/19 at 1130 AM. THERE IS A $150 fee due at time of services  Follow-Up: At [facility], you and your health needs are our priority.  As part of our continuing mission to provide you with exceptional heart care, we have created designated Provider Care Teams.  These Care Teams include your primary Cardiologist (physician) and Advanced Practice Providers (APPs -  Physician Assistants and Nurse Practitioners) who all work together to provide you with the care you need, when you need it.  Your next appointment:   2 month(s)  The format for your next appointment:   In Person  Provider:   Jyl Heinz, MD  Other Instructions  Echocardiogram An echocardiogram is a procedure that uses painless sound waves (ultrasound) to produce an image of the heart. Images from an echocardiogram can provide  important information about:  Signs of coronary artery disease (CAD).  Aneurysm detection. An aneurysm is a weak or damaged part of an artery wall that bulges out from the normal force of blood pumping through the body.  Heart size and shape. Changes in the size or shape of the heart can be associated with certain conditions, including heart failure, aneurysm, and CAD.  Heart muscle function.  Heart valve function.  Signs of a past heart attack.  Fluid buildup around the heart.  Thickening of the heart muscle.  A tumor or infectious growth around the heart valves. Tell a health care provider about:  Any allergies you have.  All medicines you are taking, including vitamins, herbs, eye drops, creams, and over-the-counter medicines.  Any blood disorders you have.  Any surgeries you have had.  Any medical conditions you have.  Whether you are pregnant or may be pregnant. What are the risks? Generally, this is a safe procedure. However, problems may occur, including:  Allergic reaction to dye (contrast) that may be used during the procedure. What happens before the procedure? No specific preparation is needed. You may eat and drink normally. What happens during the procedure?   An IV tube may be inserted into one of your veins.  You may receive contrast through this tube. A contrast is an injection that improves the quality of the pictures from your heart.  A gel will be applied to your chest.  A wand-like tool (transducer) will be moved over your chest. The gel will help to transmit the sound waves from the transducer.  The sound waves will harmlessly bounce off of your heart to allow the heart images to be captured in real-time motion. The images will be recorded on a computer. The procedure may vary among health care providers and hospitals. What happens after the procedure?  You may return to your normal, everyday life, including diet, activities, and medicines,  unless your health care provider tells you not to do that. Summary  An echocardiogram is a procedure that uses painless sound waves (ultrasound) to produce an image of the heart.  Images from an echocardiogram can provide important information about the size and shape of your heart, heart muscle function, heart valve function, and fluid buildup around your heart.  You do not need to do anything to prepare before this procedure. You may eat and drink normally.  After the echocardiogram is completed, you may return to your normal, everyday life, unless your health care provider tells you not to do that. This information is not intended to replace advice given to you by your health care provider. Make sure you discuss any questions you have with your health care provider. Document Revised: 12/25/2018 Document Reviewed: 10/06/2016 Elsevier Patient Education  2020 Elsevier Inc.   Coronary Calcium Scan A coronary calcium scan is an imaging test used to look for deposits of plaque in the inner lining of the blood vessels of the heart (coronary arteries). Plaque is made up of calcium, protein, and fatty substances. These deposits of plaque can partly clog and narrow the coronary arteries without producing any symptoms or warning signs. This puts a person at risk for a heart attack. This test is recommended for people who are at moderate risk for heart disease. The test can find plaque deposits before symptoms develop. Tell a health care provider about:  Any allergies you have.  All medicines you are taking, including vitamins, herbs, eye drops, creams, and over-the-counter medicines.  Any problems you or family members have had with anesthetic medicines.  Any blood disorders you have.  Any surgeries you have had.  Any medical conditions you have.  Whether you are pregnant or may be pregnant. What are the risks? Generally, this is a safe procedure. However, problems may occur,  including:  Harm to a pregnant woman and her unborn baby. This test involves the use of radiation. Radiation exposure can be dangerous to a pregnant woman and her unborn baby. If you are pregnant or think you may be pregnant, you should not have this procedure done.  Slight increase in the risk of cancer. This is because of the radiation involved in the test. What happens before the procedure? Ask your health care provider for any specific instructions on how to prepare for this procedure. You may be asked to avoid products that contain caffeine, tobacco, or nicotine for 4 hours before the procedure. What happens during the procedure?   You will undress and remove any jewelry from your neck or chest.  You will put on a hospital gown.  Sticky electrodes will be placed on your chest. The electrodes will be connected to an electrocardiogram (ECG) machine to record a tracing of the electrical activity of your heart.  You will lie down on a curved bed that is attached to the CT scanner.  You may be given medicine to slow down your heart rate so that clear pictures can be created.  You will be moved into the CT scanner, and the CT scanner will take pictures of your heart. During this time,  you will be asked to lie still and hold your breath for 2-3 seconds at a time while each picture of your heart is being taken. The procedure may vary among health care providers and hospitals. What happens after the procedure?  You can get dressed.  You can return to your normal activities.  It is up to you to get the results of your procedure. Ask your health care provider, or the department that is doing the procedure, when your results will be ready. Summary  A coronary calcium scan is an imaging test used to look for deposits of plaque in the inner lining of the blood vessels of the heart (coronary arteries). Plaque is made up of calcium, protein, and fatty substances.  Generally, this is a safe  procedure. Tell your health care provider if you are pregnant or may be pregnant.  Ask your health care provider for any specific instructions on how to prepare for this procedure.  A CT scanner will take pictures of your heart.  You can return to your normal activities after the scan is done. This information is not intended to replace advice given to you by your health care provider. Make sure you discuss any questions you have with your health care provider. Document Revised: 03/24/2019 Document Reviewed: 03/24/2019 Elsevier Patient Education  2020 [employer].

## 2019-10-05 NOTE — Progress Notes (Signed)
Cardiology Office Note:    Date:  10/05/2019   ID:  Justin Odom, DOB Jul 27, 1956, MRN [id number]  PCP:  Myles Lipps, MD  Cardiologist:  Garwin Brothers, MD   Referring MD: Myles Lipps, MD    ASSESSMENT:    1. Essential hypertension, benign   2. Essential hypertension   3. Mixed hyperlipidemia   4. Dyslipidemia    PLAN:    In order of problems listed above:  1. Primary prevention stressed to the patient.  Importance of compliance with diet and medication stressed and he vocalized understanding.  His blood pressure stable.  He quit smoking about 2 weeks ago.  I told him never to go back to smoking and stressed the importance of abstinence.  He promises to never go back.  Risks of smoking explained to him 2. Blood pressure stable.  He has an element of whitecoat hypertension.  He told me about blood pressure readings at home and other places and they are fine. 3. Mixed dyslipidemia: Lipids followed by primary care physician and is on statin therapy 4. With him to assess murmur heard on auscultation.  In view of multiple risk factors we will do a calcium CT score to assess risks 5. Follow-up appointment in a month or earlier if he has any concerns.   Medication Adjustments/Labs and Tests Ordered: Current medicines are reviewed at length with the patient today.  Concerns regarding medicines are outlined above.  Orders Placed This Encounter  Procedures  . CT CARDIAC SCORING  . ECHOCARDIOGRAM COMPLETE   No orders of the defined types were placed in this encounter.    History of Present Illness:    Justin Odom is a 64 y.o. male who is being seen today for the evaluation of abnormal EKG and essential hypertension at the request of Myles Lipps, MD.  Patient is a pleasant 64 year old male.  He has past medical history of essential hypertension and dyslipidemia.  He has history of stroke.  He denies any problems at this time and tells me that he takes care of  activities of daily living.  No chest pain orthopnea or PND.  He is a smoker and quit about 2 weeks ago.  At the time of my evaluation, the patient is alert awake oriented and in no distress.  Patient is very active at the job and reports he has no chest pain.  He is sexually active and sexual activity does not bring around any other symptoms.  At the time of my evaluation, the patient is alert awake oriented and in no distress.  Past Medical History:  Diagnosis Date  . CVA (cerebral infarction)   . Hyperlipidemia   . Stroke [facility]) 11/25/2014    History reviewed. No pertinent surgical history.  Current Medications: Current Meds  Medication Sig  . aspirin EC 81 MG tablet Take 81 mg by mouth daily.  Marland Kitchen atorvastatin (LIPITOR) 40 MG tablet Take 1 tablet (40 mg total) by mouth daily.  . cholecalciferol (VITAMIN D3) 25 MCG (1000 UNIT) tablet Take 1,000 Units by mouth daily.  . metoprolol succinate (TOPROL-XL) 50 MG 24 hr tablet Take 1 tablet (50 mg total) by mouth daily. Take with or immediately following a meal.     Allergies:   Patient has no known allergies.   Social History   Socioeconomic History  . Marital status: Divorced    Spouse name: Not on file  . Number of children: Not on file  . Years of education:  Not on file  . Highest education level: Not on file  Occupational History  . Not on file  Tobacco Use  . Smoking status: Former Smoker    Packs/day: 0.25    Types: Cigarettes    Quit date: 09/24/2019    Years since quitting: 0.0  . Smokeless tobacco: Never Used  . Tobacco comment: 3 cigarettes a day  Substance and Sexual Activity  . Alcohol use: Yes    Alcohol/week: 0.0 standard drinks    Comment: occ  . Drug use: Yes    Types: Marijuana  . Sexual activity: Not on file  Other Topics Concern  . Not on file  Social History Narrative  . Not on file   Social Determinants of Health   Financial Resource Strain:   . Difficulty of Paying Living Expenses: Not on file    Food Insecurity:   . Worried About Charity fundraiser in the Last Year: Not on file  . Ran Out of Food in the Last Year: Not on file  Transportation Needs:   . Lack of Transportation (Medical): Not on file  . Lack of Transportation (Non-Medical): Not on file  Physical Activity:   . Days of Exercise per Week: Not on file  . Minutes of Exercise per Session: Not on file  Stress:   . Feeling of Stress : Not on file  Social Connections:   . Frequency of Communication with Friends and Family: Not on file  . Frequency of Social Gatherings with Friends and Family: Not on file  . Attends Religious Services: Not on file  . Active Member of Clubs or Organizations: Not on file  . Attends Archivist Meetings: Not on file  . Marital Status: Not on file     Family History: The patient's family history includes Cancer in his mother and sister; Pancreatic cancer in his mother. There is no history of Colon cancer.  ROS:   Please see the history of present illness.    All other systems reviewed and are negative.  EKGs/Labs/Other Studies Reviewed:    The following studies were reviewed today: EKG reveals sinus rhythm and nonspecific ST-T changes   Recent Labs: No results found for requested labs within last 8760 hours.  Recent Lipid Panel    Component Value Date/Time   CHOL 127 02/25/2017 0846   TRIG 62 02/25/2017 0846   HDL 44 02/25/2017 0846   CHOLHDL 2.9 02/25/2017 0846   CHOLHDL 3.0 10/24/2015 1219   VLDL 8 10/24/2015 1219   LDLCALC 71 02/25/2017 0846    Physical Exam:    VS:  BP (!) 158/86   Pulse 82   Ht 5\' 11"  (1.803 m)   Wt 165 lb (74.8 kg)   SpO2 99%   BMI 23.01 kg/m     Wt Readings from Last 3 Encounters:  10/05/19 165 lb (74.8 kg)  09/28/19 162 lb 3.2 oz (73.6 kg)  09/03/18 158 lb 9.6 oz (71.9 kg)     GEN: Patient is in no acute distress HEENT: Normal NECK: No JVD; No carotid bruits LYMPHATICS: No lymphadenopathy CARDIAC: S1 S2 regular, 2/6  systolic murmur at the apex. RESPIRATORY:  Clear to auscultation without rales, wheezing or rhonchi  ABDOMEN: Soft, non-tender, non-distended MUSCULOSKELETAL:  No edema; No deformity  SKIN: Warm and dry NEUROLOGIC:  Alert and oriented x 3 PSYCHIATRIC:  Normal affect    Signed, Jenean Lindau, MD  10/05/2019 2:04 PM    [place]

## 2019-10-12 ENCOUNTER — Ambulatory Visit (HOSPITAL_BASED_OUTPATIENT_CLINIC_OR_DEPARTMENT_OTHER): Admission: RE | Admit: 2019-10-12 | Payer: 59 | Source: Ambulatory Visit

## 2019-10-12 ENCOUNTER — Telehealth: Payer: Self-pay | Admitting: Cardiology

## 2019-10-12 NOTE — Telephone Encounter (Signed)
Judie Grieve from [facility] is calling stating the approval our office sent over for the MRI has been approved and a letter was sent over through fax clarifying that. CPT Code 16109 Date of service 10/12/19-04/251/21 reference # [id number] status approved

## 2019-10-26 ENCOUNTER — Other Ambulatory Visit: Payer: Self-pay

## 2019-10-26 ENCOUNTER — Ambulatory Visit (HOSPITAL_BASED_OUTPATIENT_CLINIC_OR_DEPARTMENT_OTHER)
Admission: RE | Admit: 2019-10-26 | Discharge: 2019-10-26 | Disposition: A | Discharge status: Home / Self Care | Payer: 59 | Source: Ambulatory Visit | Attending: Cardiology | Admitting: Cardiology

## 2019-10-26 DIAGNOSIS — I10 Essential (primary) hypertension: Secondary | ICD-10-CM | POA: Insufficient documentation

## 2019-10-26 DIAGNOSIS — E785 Hyperlipidemia, unspecified: Secondary | ICD-10-CM | POA: Diagnosis present

## 2019-10-26 NOTE — Progress Notes (Signed)
  Echocardiogram 2D Echocardiogram has been performed.  Justin Odom 10/26/2019, 1:42 PM

## 2019-11-02 ENCOUNTER — Other Ambulatory Visit: Payer: Self-pay

## 2019-11-02 ENCOUNTER — Ambulatory Visit (INDEPENDENT_AMBULATORY_CARE_PROVIDER_SITE_OTHER)
Admission: RE | Admit: 2019-11-02 | Discharge: 2019-11-02 | Disposition: A | Discharge status: Home / Self Care | Payer: Self-pay | Source: Ambulatory Visit | Attending: Cardiology | Admitting: Cardiology

## 2019-11-02 ENCOUNTER — Encounter (INDEPENDENT_AMBULATORY_CARE_PROVIDER_SITE_OTHER): Payer: Self-pay

## 2019-11-02 DIAGNOSIS — E785 Hyperlipidemia, unspecified: Secondary | ICD-10-CM

## 2019-11-02 DIAGNOSIS — I10 Essential (primary) hypertension: Secondary | ICD-10-CM

## 2019-11-12 ENCOUNTER — Other Ambulatory Visit: Payer: Self-pay

## 2019-11-12 ENCOUNTER — Telehealth: Payer: Self-pay | Admitting: Cardiology

## 2019-11-12 DIAGNOSIS — E785 Hyperlipidemia, unspecified: Secondary | ICD-10-CM

## 2019-11-12 DIAGNOSIS — I10 Essential (primary) hypertension: Secondary | ICD-10-CM

## 2019-11-12 NOTE — Telephone Encounter (Signed)
Patient was returning a call from [place] in regards to some labs and CT results. The patient is at work, so if he is not able to answer he asks that the office leave a detailed message on his machine.

## 2019-11-16 ENCOUNTER — Other Ambulatory Visit: Payer: 59

## 2019-11-17 LAB — HEPATIC FUNCTION PANEL
ALT: 5 IU/L (ref 0–44)
AST: 14 IU/L (ref 0–40)
Albumin: 4.2 g/dL (ref 3.8–4.8)
Alkaline Phosphatase: 80 IU/L (ref 39–117)
Bilirubin Total: 0.6 mg/dL (ref 0.0–1.2)
Bilirubin, Direct: 0.16 mg/dL (ref 0.00–0.40)
Total Protein: 7.2 g/dL (ref 6.0–8.5)

## 2019-11-17 LAB — BASIC METABOLIC PANEL
BUN/Creatinine Ratio: 10 (ref 10–24)
BUN: 9 mg/dL (ref 8–27)
CO2: 25 mmol/L (ref 20–29)
Calcium: 9.4 mg/dL (ref 8.6–10.2)
Chloride: 100 mmol/L (ref 96–106)
Creatinine, Ser: 0.86 mg/dL (ref 0.76–1.27)
GFR calc Af Amer: 107 mL/min/{1.73_m2} (ref >59)
GFR calc non Af Amer: 93 mL/min/{1.73_m2} (ref >59)
Glucose: 91 mg/dL (ref 65–99)
Potassium: 4 mmol/L (ref 3.5–5.2)
Sodium: 136 mmol/L (ref 134–144)

## 2019-11-17 LAB — LIPID PANEL
Chol/HDL Ratio: 3.5 ratio (ref 0.0–5.0)
Cholesterol, Total: 114 mg/dL (ref 100–199)
HDL: 33 mg/dL — ABNORMAL LOW (ref >39)
LDL Chol Calc (NIH): 65 mg/dL (ref 0–99)
Triglycerides: 81 mg/dL (ref 0–149)
VLDL Cholesterol Cal: 16 mg/dL (ref 5–40)

## 2019-11-30 ENCOUNTER — Ambulatory Visit: Payer: 59 | Admitting: Cardiology

## 2019-12-15 ENCOUNTER — Other Ambulatory Visit: Payer: Self-pay

## 2019-12-15 ENCOUNTER — Telehealth: Payer: Self-pay | Admitting: Family Medicine

## 2019-12-15 DIAGNOSIS — E785 Hyperlipidemia, unspecified: Secondary | ICD-10-CM

## 2019-12-15 MED ORDER — ATORVASTATIN CALCIUM 40 MG PO TABS: 40.0000 mg | ORAL_TABLET | Freq: Every day | ORAL | 3 refills | Status: DC

## 2019-12-15 NOTE — Telephone Encounter (Signed)
refilled 

## 2019-12-15 NOTE — Telephone Encounter (Signed)
.  What is the name of the medication? atorvastatin (LIPITOR) 40 MG tablet [[id number]]    Have you contacted your pharmacy to request a refill? Yes, they will not fill this script until 12/22/19. He was told to double up on his pills, so he has not had this med in a week. He would like a script for 14 pills and he will pay out of pocket for it. Insurance will not.  Which pharmacy would you like this sent to?    Pharmacy  [facility] [address] [place]),  - [address]  [address] [place]) [place] [address]  Phone:  [phone] Fax:  [phone]       Patient notified that their request is being sent to the clinical staff for review and that they should receive a call once it is complete. If they do not receive a call within 72 hours they can check with their pharmacy or our office.

## 2019-12-21 ENCOUNTER — Other Ambulatory Visit: Payer: Self-pay

## 2019-12-21 ENCOUNTER — Ambulatory Visit (INDEPENDENT_AMBULATORY_CARE_PROVIDER_SITE_OTHER): Payer: 59 | Admitting: Cardiology

## 2019-12-21 ENCOUNTER — Encounter: Payer: Self-pay | Admitting: Cardiology

## 2019-12-21 VITALS — BP 142/70 | HR 68 | Ht 71.0 in | Wt 159.0 lb

## 2019-12-21 DIAGNOSIS — I10 Essential (primary) hypertension: Secondary | ICD-10-CM

## 2019-12-21 DIAGNOSIS — E782 Mixed hyperlipidemia: Secondary | ICD-10-CM

## 2019-12-21 DIAGNOSIS — Z8673 Personal history of transient ischemic attack (TIA), and cerebral infarction without residual deficits: Secondary | ICD-10-CM | POA: Diagnosis not present

## 2019-12-21 DIAGNOSIS — E785 Hyperlipidemia, unspecified: Secondary | ICD-10-CM

## 2019-12-21 MED ORDER — METOPROLOL SUCCINATE ER 50 MG PO TB24: 50.0000 mg | ORAL_TABLET | Freq: Every day | ORAL | 2 refills | Status: DC

## 2019-12-21 MED ORDER — ATORVASTATIN CALCIUM 40 MG PO TABS: 40.0000 mg | ORAL_TABLET | Freq: Every day | ORAL | 2 refills | Status: DC

## 2019-12-21 NOTE — Progress Notes (Signed)
Cardiology Office Note:    Date:  12/21/2019   ID:  [name], DOB 08/07/1956, MRN [id number]  PCP:  Myles Lipps, MD  Cardiologist:  Garwin Brothers, MD   Referring MD: Myles Lipps, MD    ASSESSMENT:    1. Essential hypertension   2. Dyslipidemia   3. Mixed hyperlipidemia   4. H/O TIA (transient ischemic attack) and stroke    PLAN:    In order of problems listed above:  1. Secondary prevention stressed with the patient.  Importance of compliance with diet medication stressed and he vocalized understanding.  I told him to walk at least half an hour a day 5 days a week and he promises to do so. 2. Essential hypertension: Blood pressure is mildly elevated.  I will resume his beta-blocker and prescription was given.  He will keep a track of his pulse and blood pressure. 3. Mixed dyslipidemia: We will have a Chem-7 and baseline liver panel today.  We will reinitiate atorvastatin 40 mg daily and will be back in 6 weeks for liver lipid check. 4. Patient will be seen in follow-up appointment in 6 months or earlier if the patient has any concerns    Medication Adjustments/Labs and Tests Ordered: Current medicines are reviewed at length with the patient today.  Concerns regarding medicines are outlined above.  No orders of the defined types were placed in this encounter.  No orders of the defined types were placed in this encounter.    No chief complaint on file.    History of Present Illness:    Justin Odom is a 64 y.o. male.  Patient has past medical history of extensive calcification in the coronary arteries diagnosed by elevated calcium score and essential hypertension.  He denies any problems at this time and takes care of activities of daily living.  No chest pain orthopnea PND.  He has had some issues with medication refills and has not taken his medicines for the past couple of weeks.  At the time of my evaluation, the patient is alert awake oriented and in no  distress.  Past Medical History:  Diagnosis Date  . CVA (cerebral infarction)   . Hyperlipidemia   . Stroke [facility]) 11/25/2014    History reviewed. No pertinent surgical history.  Current Medications: Current Meds  Medication Sig  . aspirin EC 81 MG tablet Take 81 mg by mouth daily.  Marland Kitchen atorvastatin (LIPITOR) 40 MG tablet Take 1 tablet (40 mg total) by mouth daily.  . cholecalciferol (VITAMIN D3) 25 MCG (1000 UNIT) tablet Take 1,000 Units by mouth daily.  . metoprolol succinate (TOPROL-XL) 50 MG 24 hr tablet Take 1 tablet (50 mg total) by mouth daily. Take with or immediately following a meal.     Allergies:   Patient has no known allergies.   Social History   Socioeconomic History  . Marital status: Divorced    Spouse name: Not on file  . Number of children: Not on file  . Years of education: Not on file  . Highest education level: Not on file  Occupational History  . Not on file  Tobacco Use  . Smoking status: Former Smoker    Packs/day: 0.25    Types: Cigarettes    Quit date: 09/24/2019    Years since quitting: 0.2  . Smokeless tobacco: Never Used  . Tobacco comment: 3 cigarettes a day  Substance and Sexual Activity  . Alcohol use: Yes    Alcohol/week: 0.0 standard  drinks    Comment: occ  . Drug use: Yes    Types: Marijuana  . Sexual activity: Not on file  Other Topics Concern  . Not on file  Social History Narrative  . Not on file   Social Determinants of Health   Financial Resource Strain:   . Difficulty of Paying Living Expenses:   Food Insecurity:   . Worried About Programme researcher, broadcasting/film/video in the Last Year:   . Barista in the Last Year:   Transportation Needs:   . Freight forwarder (Medical):   Marland Kitchen Lack of Transportation (Non-Medical):   Physical Activity:   . Days of Exercise per Week:   . Minutes of Exercise per Session:   Stress:   . Feeling of Stress :   Social Connections:   . Frequency of Communication with Friends and Family:   .  Frequency of Social Gatherings with Friends and Family:   . Attends Religious Services:   . Active Member of Clubs or Organizations:   . Attends Banker Meetings:   Marland Kitchen Marital Status:      Family History: The patient's family history includes Cancer in his mother and sister; Pancreatic cancer in his mother. There is no history of Colon cancer.  ROS:   Please see the history of present illness.    All other systems reviewed and are negative.  EKGs/Labs/Other Studies Reviewed:    The following studies were reviewed today: IMPRESSIONS    1. Left ventricular ejection fraction, by estimation, is 60 to 65%. The  left ventricle has normal function. The average left ventricular global  longitudinal strain is -17.8 %.  2. Right ventricular systolic function is normal. There is mildly  elevated pulmonary artery systolic pressure.  3. Mild mitral valve regurgitation.  4. The aortic valve is tricuspid.  5. There is borderline dilatation of the ascending aorta measuring 36 mm.   IMPRESSION: Coronary calcium score of 341. This was 94th percentile for age and sex matched control.  Coronary calcification noted in all three coronary distributions.  Recommendation aggressive risk factor modification including high-potency statin.  Chilton Si, MD   Electronically Signed   By: Chilton Si   On: 11/10/2019 00:20   Recent Labs: 11/16/2019: ALT 5; BUN 9; Creatinine, Ser 0.86; Potassium 4.0; Sodium 136  Recent Lipid Panel    Component Value Date/Time   CHOL 114 11/16/2019 1126   TRIG 81 11/16/2019 1126   HDL 33 (L) 11/16/2019 1126   CHOLHDL 3.5 11/16/2019 1126   CHOLHDL 3.0 10/24/2015 1219   VLDL 8 10/24/2015 1219   LDLCALC 65 11/16/2019 1126    Physical Exam:    VS:  BP (!) 142/70   Pulse 68   Ht 5\' 11"  (1.803 m)   Wt 159 lb (72.1 kg)   SpO2 95%   BMI 22.18 kg/m     Wt Readings from Last 3 Encounters:  12/21/19 159 lb (72.1 kg)   10/05/19 165 lb (74.8 kg)  09/28/19 162 lb 3.2 oz (73.6 kg)     GEN: Patient is in no acute distress HEENT: Normal NECK: No JVD; No carotid bruits LYMPHATICS: No lymphadenopathy CARDIAC: Hear sounds regular, 2/6 systolic murmur at the apex. RESPIRATORY:  Clear to auscultation without rales, wheezing or rhonchi  ABDOMEN: Soft, non-tender, non-distended MUSCULOSKELETAL:  No edema; No deformity  SKIN: Warm and dry NEUROLOGIC:  Alert and oriented x 3 PSYCHIATRIC:  Normal affect   Signed, 11/26/19 Gevorg Brum,  MD  12/21/2019 9:55 AM    [facility]

## 2019-12-21 NOTE — Patient Instructions (Addendum)
Medication Instructions:  No medication changes *If you need a refill on your cardiac medications before your next appointment, please call your pharmacy*   Lab Work: Your physician recommends that you have a BMET and LFT's done in the office today.  Your physician recommends that you return for lab work in: 3 months (03/21/2020). You need to have labs done when you are fasting.  You can come Monday through Friday 8:30 am to 12:00 pm and 1:15 to 4:30. You do not need to make an appointment as the order has already been placed. The labs you are going to have done are BMET, CBC, TSH, LFT and Lipids.   If you have labs (blood work) drawn today and your tests are completely normal, you will receive your results only by: Marland Kitchen MyChart Message (if you have MyChart) OR . A paper copy in the mail If you have any lab test that is abnormal or we need to change your treatment, we will call you to review the results.   Testing/Procedures: None ordered   Follow-Up: At [facility], you and your health needs are our priority.  As part of our continuing mission to provide you with exceptional heart care, we have created designated Provider Care Teams.  These Care Teams include your primary Cardiologist (physician) and Advanced Practice Providers (APPs -  Physician Assistants and Nurse Practitioners) who all work together to provide you with the care you need, when you need it.  We recommend signing up for the patient portal called "MyChart".  Sign up information is provided on this After Visit Summary.  MyChart is used to connect with patients for Virtual Visits (Telemedicine).  Patients are able to view lab/test results, encounter notes, upcoming appointments, etc.  Non-urgent messages can be sent to your provider as well.   To learn more about what you can do with MyChart, go to [email or web address].    Your next appointment:   6 month(s)  The format for your next appointment:   In  Person  Provider:   Belva Crome, MD   Other Instructions NA

## 2019-12-22 LAB — BASIC METABOLIC PANEL
BUN/Creatinine Ratio: 11 (ref 10–24)
BUN: 10 mg/dL (ref 8–27)
CO2: 25 mmol/L (ref 20–29)
Calcium: 9.3 mg/dL (ref 8.6–10.2)
Chloride: 104 mmol/L (ref 96–106)
Creatinine, Ser: 0.88 mg/dL (ref 0.76–1.27)
GFR calc Af Amer: 106 mL/min/{1.73_m2} (ref >59)
GFR calc non Af Amer: 92 mL/min/{1.73_m2} (ref >59)
Glucose: 94 mg/dL (ref 65–99)
Potassium: 4.2 mmol/L (ref 3.5–5.2)
Sodium: 141 mmol/L (ref 134–144)

## 2019-12-22 LAB — HEPATIC FUNCTION PANEL
ALT: 7 IU/L (ref 0–44)
AST: 16 IU/L (ref 0–40)
Albumin: 4 g/dL (ref 3.8–4.8)
Alkaline Phosphatase: 80 IU/L (ref 39–117)
Bilirubin Total: 0.2 mg/dL (ref 0.0–1.2)
Bilirubin, Direct: 0.08 mg/dL (ref 0.00–0.40)
Total Protein: 7.1 g/dL (ref 6.0–8.5)

## 2019-12-28 ENCOUNTER — Ambulatory Visit: Payer: 59 | Admitting: Family Medicine

## 2020-01-04 ENCOUNTER — Other Ambulatory Visit: Payer: Self-pay

## 2020-01-04 ENCOUNTER — Ambulatory Visit (INDEPENDENT_AMBULATORY_CARE_PROVIDER_SITE_OTHER): Payer: 59 | Admitting: Family Medicine

## 2020-01-04 ENCOUNTER — Encounter: Payer: Self-pay | Admitting: Family Medicine

## 2020-01-04 VITALS — BP 130/70 | HR 55 | Temp 98.0°F | Ht 71.0 in | Wt 157.0 lb

## 2020-01-04 DIAGNOSIS — Z720 Tobacco use: Secondary | ICD-10-CM

## 2020-01-04 DIAGNOSIS — Z8673 Personal history of transient ischemic attack (TIA), and cerebral infarction without residual deficits: Secondary | ICD-10-CM | POA: Diagnosis not present

## 2020-01-04 DIAGNOSIS — I10 Essential (primary) hypertension: Secondary | ICD-10-CM | POA: Diagnosis not present

## 2020-01-04 DIAGNOSIS — E785 Hyperlipidemia, unspecified: Secondary | ICD-10-CM | POA: Diagnosis not present

## 2020-01-04 NOTE — Patient Instructions (Signed)
° ° ° °  If you have lab work done today you will be contacted with your lab results within the next 2 weeks.  If you have not heard from us then please contact us. The fastest way to get your results is to register for My Chart. ° ° °IF you received an x-ray today, you will receive an invoice from [facility]. Please contact [facility] at [phone] with questions or concerns regarding your invoice.  ° °IF you received labwork today, you will receive an invoice from [facility]. Please contact [facility] at [phone] with questions or concerns regarding your invoice.  ° °Our billing staff will not be able to assist you with questions regarding bills from these companies. ° °You will be contacted with the lab results as soon as they are available. The fastest way to get your results is to activate your My Chart account. Instructions are located on the last page of this paperwork. If you have not heard from us regarding the results in 2 weeks, please contact this office. °  ° ° ° °

## 2020-01-04 NOTE — Progress Notes (Signed)
4/19/20212:49 PM  Justin Odom 07/06/1956, 64 y.o., male [id number]  Chief Complaint  Patient presents with  . Hypertension    HPI:   Patient is a 64 y.o. male with past medical history significant for HTN, h/o CVA, HLP, tobacco use who presents today for routine followup  Last OV Jan 2021 Saw cards 2 weeks ago - meds restarted He is doing well  Has no acute concerns today He has completed covid vaccine Has been cutting back on smoking, about to 4 cig a day Has rejoined the gym, very active at work  Lab Results  Component Value Date   CREATININE 0.88 12/21/2019   BUN 10 12/21/2019   NA 141 12/21/2019   K 4.2 12/21/2019   CL 104 12/21/2019   CO2 25 12/21/2019   Lab Results  Component Value Date   CHOL 114 11/16/2019   HDL 33 (L) 11/16/2019   LDLCALC 65 11/16/2019   TRIG 81 11/16/2019   CHOLHDL 3.5 11/16/2019   Lab Results  Component Value Date   ALT 7 12/21/2019   AST 16 12/21/2019   ALKPHOS 80 12/21/2019   BILITOT 0.2 12/21/2019    Depression screen PHQ 2/9 01/04/2020 09/28/2019 09/03/2018  Decreased Interest 0 0 0  Down, Depressed, Hopeless 0 0 0  PHQ - 2 Score 0 0 0    Fall Risk  01/04/2020 09/28/2019 09/03/2018 11/11/2017 10/21/2017  Falls in the past year? 0 0 0 No No  Number falls in past yr: 0 0 - - -  Injury with Fall? 0 0 - - -  Follow up Falls evaluation completed - - - -     No Known Allergies  Prior to Admission medications   Medication Sig Start Date End Date Taking? Authorizing Provider  aspirin EC 81 MG tablet Take 81 mg by mouth daily.   Yes [provider]  atorvastatin (LIPITOR) 40 MG tablet Take 1 tablet (40 mg total) by mouth daily. 12/21/19  Yes Revankar, Reita Cliche, MD  cholecalciferol (VITAMIN D3) 25 MCG (1000 UNIT) tablet Take 1,000 Units by mouth daily.   Yes [provider]  metoprolol succinate (TOPROL-XL) 50 MG 24 hr tablet Take 1 tablet (50 mg total) by mouth daily. Take with or immediately following a meal.  12/21/19  Yes Revankar, Reita Cliche, MD    Past Medical History:  Diagnosis Date  . CVA (cerebral infarction)   . Hyperlipidemia   . Stroke ([place]) 11/25/2014    No past surgical history on file.  Social History   Tobacco Use  . Smoking status: Former Smoker    Packs/day: 0.25    Types: Cigarettes    Quit date: 09/24/2019    Years since quitting: 0.2  . Smokeless tobacco: Never Used  . Tobacco comment: 3 cigarettes a day  Substance Use Topics  . Alcohol use: Yes    Alcohol/week: 0.0 standard drinks    Comment: occ    Family History  Problem Relation Age of Onset  . Cancer Mother   . Pancreatic cancer Mother   . Cancer Sister   . Colon cancer Neg Hx     Review of Systems  Constitutional: Negative for chills and fever.  Eyes: Negative for blurred vision.  Respiratory: Negative for cough and shortness of breath.   Cardiovascular: Negative for chest pain, palpitations and leg swelling.  Gastrointestinal: Negative for abdominal pain, nausea and vomiting.  Neurological: Negative for speech change and focal weakness.     OBJECTIVE:  Today's  Vitals   01/04/20 1431 01/04/20 1439  BP: (!) 152/91 130/70  Pulse: (!) 55   Temp: 98 F (36.7 C)   SpO2: 98%   Weight: 157 lb (71.2 kg)   Height: 5\' 11"  (1.803 m)    Body mass index is 21.9 kg/m.   Physical Exam Vitals and nursing note reviewed.  Constitutional:      Appearance: He is well-developed.  HENT:     Head: Normocephalic and atraumatic.  Eyes:     Conjunctiva/sclera: Conjunctivae normal.     Pupils: Pupils are equal, round, and reactive to light.  Cardiovascular:     Rate and Rhythm: Normal rate and regular rhythm.     Heart sounds: No murmur. No friction rub. No gallop.   Pulmonary:     Effort: Pulmonary effort is normal.     Breath sounds: Normal breath sounds. No wheezing or rales.  Musculoskeletal:     Cervical back: Neck supple.     Right lower leg: No edema.     Left lower leg: No edema.  Skin:     General: Skin is warm and dry.  Neurological:     Mental Status: He is alert and oriented to person, place, and time.     No results found for this or any previous visit (from the past 24 hour(s)).  No results found.   ASSESSMENT and PLAN  1. Essential hypertension Controlled. Continue current regime.   2. Hyperlipidemia LDL goal <70 Controlled. Continue current regime.   3. Tobacco abuse Cont working on quitting.  4. H/O TIA (transient ischemic attack) and stroke On ASA 81mg  and statin. BP at goal. Cont working on smoking cessation.  Return in about 6 months (around 07/05/2020).    , MD Primary Care at [facility] [address] [place], [address] [place] Ph.  [phone] Fax [phone]

## 2020-07-04 ENCOUNTER — Other Ambulatory Visit: Payer: Self-pay

## 2020-07-04 ENCOUNTER — Encounter: Payer: Self-pay | Admitting: Cardiology

## 2020-07-04 ENCOUNTER — Ambulatory Visit (INDEPENDENT_AMBULATORY_CARE_PROVIDER_SITE_OTHER): Payer: 59 | Admitting: Emergency Medicine

## 2020-07-04 ENCOUNTER — Ambulatory Visit: Payer: 59 | Admitting: Family Medicine

## 2020-07-04 ENCOUNTER — Ambulatory Visit (INDEPENDENT_AMBULATORY_CARE_PROVIDER_SITE_OTHER): Payer: 59 | Admitting: Cardiology

## 2020-07-04 ENCOUNTER — Encounter: Payer: Self-pay | Admitting: Emergency Medicine

## 2020-07-04 VITALS — BP 142/78 | HR 60 | Ht 71.0 in | Wt 153.0 lb

## 2020-07-04 VITALS — BP 127/76 | HR 61 | Temp 98.7°F | Ht 71.0 in | Wt 154.0 lb

## 2020-07-04 DIAGNOSIS — I10 Essential (primary) hypertension: Secondary | ICD-10-CM | POA: Diagnosis not present

## 2020-07-04 DIAGNOSIS — Z8673 Personal history of transient ischemic attack (TIA), and cerebral infarction without residual deficits: Secondary | ICD-10-CM

## 2020-07-04 DIAGNOSIS — E785 Hyperlipidemia, unspecified: Secondary | ICD-10-CM

## 2020-07-04 DIAGNOSIS — R931 Abnormal findings on diagnostic imaging of heart and coronary circulation: Secondary | ICD-10-CM

## 2020-07-04 DIAGNOSIS — Z7689 Persons encountering health services in other specified circumstances: Secondary | ICD-10-CM | POA: Diagnosis not present

## 2020-07-04 DIAGNOSIS — E782 Mixed hyperlipidemia: Secondary | ICD-10-CM

## 2020-07-04 DIAGNOSIS — F17210 Nicotine dependence, cigarettes, uncomplicated: Secondary | ICD-10-CM

## 2020-07-04 DIAGNOSIS — Z720 Tobacco use: Secondary | ICD-10-CM

## 2020-07-04 HISTORY — DX: Abnormal findings on diagnostic imaging of heart and coronary circulation: R93.1

## 2020-07-04 NOTE — Progress Notes (Signed)
Cardiology Office Note:    Date:  07/04/2020   ID:  Justin Odom, DOB 1956/04/17, MRN [id number]  PCP:  Georgina Quint, MD  Cardiologist:  Garwin Brothers, MD   Referring MD: Myles Lipps, MD    ASSESSMENT:    1. Essential hypertension   2. Elevated coronary artery calcium score   3. Mixed hyperlipidemia   4. H/O TIA (transient ischemic attack) and stroke   5. Tobacco abuse    PLAN:    In order of problems listed above:  1. Coronary artery disease: By elevated calcium score: Secondary prevention stressed with the patient.  Importance of compliance with diet medication stressed any vocalized understanding.  I told at least half an hour a day and he promises to do so. 2. Essential hypertension: Blood pressure stable and diet with lifestyle modification was emphasized.  Exercise stressed 3. Mixed dyslipidemia: Lipids were reviewed with him and is fasting and will have blood work today. 4. Cigarette smoker: I spent 5 minutes with the patient discussing solely about smoking. Smoking cessation was counseled. I suggested to the patient also different medications and pharmacological interventions. Patient is keen to try stopping on its own at this time. He will get back to me if he needs any further assistance in this matter. 5. Patient will be seen in follow-up appointment in 6 months or earlier if the patient has any concerns    Medication Adjustments/Labs and Tests Ordered: Current medicines are reviewed at length with the patient today.  Concerns regarding medicines are outlined above.  Orders Placed This Encounter  Procedures  . Basic metabolic panel  . CBC with Differential/Platelet  . Hepatic function panel  . Lipid panel  . TSH   No orders of the defined types were placed in this encounter.    No chief complaint on file.    History of Present Illness:    Justin Odom is a 64 y.o. male.  Patient has history of elevated calcium score, essential  hypertension dyslipidemia and history of smoking.  He denies any problems at this time and takes care of activities of daily living.  No chest pain orthopnea or PND.  At the time of my evaluation, the patient is alert awake oriented and in no distress.  Unfortunately continues to smoke and leads a sedentary lifestyle.  At the time of my evaluation, the patient is alert awake oriented and in no distress.  Past Medical History:  Diagnosis Date  . Borderline high blood pressure 01/10/2015  . Colon cancer screening 10/24/2015  . CVA (cerebral infarction)   . Dependence on nicotine from other tobacco product 10/24/2015  . 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  Marland Kitchen Dyslipidemia 05/02/2015  . Essential hypertension 02/02/2019  . H/O TIA (transient ischemic attack) and stroke 11/29/2014  . Hyperlipidemia   . Premature heartbeats 09/28/2019  . Stroke ([facility]) 11/25/2014  . Tobacco abuse 02/18/2017    Past Surgical History:  Procedure Laterality Date  . NO PAST SURGERIES      Current Medications: Current Meds  Medication Sig  . aspirin EC 81 MG tablet Take 81 mg by mouth daily.  Marland Kitchen atorvastatin (LIPITOR) 40 MG tablet Take 1 tablet (40 mg total) by mouth daily.  . cholecalciferol (VITAMIN D3) 25 MCG (1000 UNIT) tablet Take 1,000 Units by mouth daily.  . metoprolol succinate (TOPROL-XL) 50 MG 24 hr tablet Take  1 tablet (50 mg total) by mouth daily. Take with or immediately following a meal.     Allergies:   Patient has no known allergies.   Social History   Socioeconomic History  . Marital status: Divorced    Spouse name: Not on file  . Number of children: Not on file  . Years of education: Not on file  . Highest education level: Not on file  Occupational History  . Not on file  Tobacco Use  . Smoking status: Former Smoker    Packs/day: 0.25    Types: Cigarettes    Quit date:  09/24/2019    Years since quitting: 0.7  . Smokeless tobacco: Never Used  . Tobacco comment: 3 cigarettes a day  Substance and Sexual Activity  . Alcohol use: Yes    Alcohol/week: 0.0 standard drinks    Comment: occ  . Drug use: Yes    Types: Marijuana  . Sexual activity: Not on file  Other Topics Concern  . Not on file  Social History Narrative  . Not on file   Social Determinants of Health   Financial Resource Strain:   . Difficulty of Paying Living Expenses: Not on file  Food Insecurity:   . Worried About Programme researcher, broadcasting/film/video in the Last Year: Not on file  . Ran Out of Food in the Last Year: Not on file  Transportation Needs:   . Lack of Transportation (Medical): Not on file  . Lack of Transportation (Non-Medical): Not on file  Physical Activity:   . Days of Exercise per Week: Not on file  . Minutes of Exercise per Session: Not on file  Stress:   . Feeling of Stress : Not on file  Social Connections:   . Frequency of Communication with Friends and Family: Not on file  . Frequency of Social Gatherings with Friends and Family: Not on file  . Attends Religious Services: Not on file  . Active Member of Clubs or Organizations: Not on file  . Attends Banker Meetings: Not on file  . Marital Status: Not on file     Family History: The patient's family history includes Cancer in his mother and sister; Pancreatic cancer in his mother. There is no history of Colon cancer.  ROS:   Please see the history of present illness.    All other systems reviewed and are negative.  EKGs/Labs/Other Studies Reviewed:    The following studies were reviewed today: I discussed my findings with the patient at extensive length   Recent Labs: 12/21/2019: ALT 7; BUN 10; Creatinine, Ser 0.88; Potassium 4.2; Sodium 141  Recent Lipid Panel    Component Value Date/Time   CHOL 114 11/16/2019 1126   TRIG 81 11/16/2019 1126   HDL 33 (L) 11/16/2019 1126   CHOLHDL 3.5 11/16/2019 1126     CHOLHDL 3.0 10/24/2015 1219   VLDL 8 10/24/2015 1219   LDLCALC 65 11/16/2019 1126    Physical Exam:    VS:  BP (!) 142/78   Pulse 60   Ht 5\' 11"  (1.803 m)   Wt 153 lb 0.6 oz (69.4 kg)   SpO2 97%   BMI 21.34 kg/m     Wt Readings from Last 3 Encounters:  07/04/20 153 lb 0.6 oz (69.4 kg)  01/04/20 157 lb (71.2 kg)  12/21/19 159 lb (72.1 kg)     GEN: Patient is in no acute distress HEENT: Normal NECK: No JVD; No carotid bruits LYMPHATICS: No lymphadenopathy CARDIAC:  Hear sounds regular, 2/6 systolic murmur at the apex. RESPIRATORY:  Clear to auscultation without rales, wheezing or rhonchi  ABDOMEN: Soft, non-tender, non-distended MUSCULOSKELETAL:  No edema; No deformity  SKIN: Warm and dry NEUROLOGIC:  Alert and oriented x 3 PSYCHIATRIC:  Normal affect   Signed, Garwin Brothers, MD  07/04/2020 8:49 AM    [facility]

## 2020-07-04 NOTE — Patient Instructions (Signed)

## 2020-07-04 NOTE — Progress Notes (Signed)
Justin Odom 64 y.o.   Chief Complaint  Patient presents with  . Transitions Of Care    from [place]. HTN and get to know provider     HISTORY OF PRESENT ILLNESS: This is a 64 y.o. male with history of hypertension and dyslipidemia here to establish care with me.  Used to see Dr. Leretha Pol. Has no complaints or medical concerns today. Just had cardiology evaluation this morning.  In order of problems listed above:  1. Coronary artery disease: By elevated calcium score: Secondary prevention stressed with the patient.  Importance of compliance with diet medication stressed any vocalized understanding.  I told at least half an hour a day and he promises to do so. 2. Essential hypertension: Blood pressure stable and diet with lifestyle modification was emphasized.  Exercise stressed 3. Mixed dyslipidemia: Lipids were reviewed with him and is fasting and will have blood work today. 4. Cigarette smoker: I spent 5 minutes with the patient discussing solely about smoking. Smoking cessation was counseled. I suggested to the patient also different medications and pharmacological interventions. Patient is keen to try stopping on its own at this time. He will get back to me if he needs any further assistance in this matter. 5. Patient will be seen in follow-up appointment in 6 months or earlier if the patient has any concerns   HPI   Prior to Admission medications   Medication Sig Start Date End Date Taking? Authorizing Provider  aspirin EC 81 MG tablet Take 81 mg by mouth daily.   Yes [provider]  atorvastatin (LIPITOR) 40 MG tablet Take 1 tablet (40 mg total) by mouth daily. 12/21/19  Yes Revankar, Aundra Dubin, MD  cholecalciferol (VITAMIN D3) 25 MCG (1000 UNIT) tablet Take 1,000 Units by mouth daily.   Yes [provider]  metoprolol succinate (TOPROL-XL) 50 MG 24 hr tablet Take 1 tablet (50 mg total) by mouth daily. Take with or immediately following a meal. 12/21/19  Yes  Revankar, Aundra Dubin, MD    No Known Allergies  Patient Active Problem List   Diagnosis Date Noted  . 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 Hyperlipidemia 10/05/2019  . Premature heartbeats 09/28/2019  . Essential hypertension 02/02/2019  . Tobacco abuse 02/18/2017  . 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 Dependence on nicotine from other tobacco product 10/24/2015  . Dyslipidemia 05/02/2015  . H/O TIA (transient ischemic attack) and stroke 11/29/2014    Past Medical History:  Diagnosis Date  . Borderline high blood pressure 01/10/2015  . Colon cancer screening 10/24/2015  . CVA (cerebral infarction)   . Dependence on nicotine from other tobacco product 10/24/2015  . 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  Marland Kitchen Dyslipidemia 05/02/2015  . Essential hypertension 02/02/2019  . H/O TIA (transient ischemic attack) and stroke 11/29/2014  . Hyperlipidemia   . Premature heartbeats 09/28/2019  . Stroke (HCC) 11/25/2014  . Tobacco abuse 02/18/2017    Past Surgical History:  Procedure Laterality Date  . NO PAST SURGERIES      Social History   Socioeconomic History  . Marital status: Divorced    Spouse name: Not on file  . Number of children: Not on file  . Years of education: Not on file  . Highest education level: Not on file  Occupational History  . Not on file  Tobacco Use  . Smoking  status: Current Some Day Smoker    Packs/day: 0.25    Types: Cigarettes    Last attempt to quit: 09/24/2019    Years since quitting: 0.7  . Smokeless tobacco: Never Used  . Tobacco comment: 3 cigarettes a day  Substance and Sexual Activity  . Alcohol use: Yes    Alcohol/week: 0.0 standard drinks    Comment: occ  . Drug use: Yes    Types: Marijuana  . Sexual activity: Not on file  Other Topics Concern  . Not on file  Social History Narrative   . Not on file   Social Determinants of Health   Financial Resource Strain:   . Difficulty of Paying Living Expenses: Not on file  Food Insecurity:   . Worried About Programme researcher, broadcasting/film/videounning Out of Food in the Last Year: Not on file  . Ran Out of Food in the Last Year: Not on file  Transportation Needs:   . Lack of Transportation (Medical): Not on file  . Lack of Transportation (Non-Medical): Not on file  Physical Activity:   . Days of Exercise per Week: Not on file  . Minutes of Exercise per Session: Not on file  Stress:   . Feeling of Stress : Not on file  Social Connections:   . Frequency of Communication with Friends and Family: Not on file  . Frequency of Social Gatherings with Friends and Family: Not on file  . Attends Religious Services: Not on file  . Active Member of Clubs or Organizations: Not on file  . Attends BankerClub or Organization Meetings: Not on file  . Marital Status: Not on file  Intimate Partner Violence:   . Fear of Current or Ex-Partner: Not on file  . Emotionally Abused: Not on file  . Physically Abused: Not on file  . Sexually Abused: Not on file    Family History  Problem Relation Age of Onset  . Cancer Mother   . Pancreatic cancer Mother   . Cancer Sister   . Colon cancer Neg Hx      Review of Systems  Constitutional: Negative.  Negative for chills and fever.  HENT: Negative.  Negative for congestion and sore throat.   Respiratory: Negative.  Negative for cough and shortness of breath.   Cardiovascular: Negative.  Negative for chest pain and palpitations.  Gastrointestinal: Negative.  Negative for abdominal pain, diarrhea, nausea and vomiting.  Genitourinary: Negative.  Negative for dysuria and hematuria.  Skin: Negative.  Negative for rash.  Neurological: Negative.  Negative for dizziness and headaches.  All other systems reviewed and are negative.  Today's Vitals   07/04/20 1414  BP: 127/76  Pulse: 61  Temp: 98.7 F (37.1 C)  TempSrc: Temporal  SpO2:  99%  Weight: 154 lb (69.9 kg)  Height: 5\' 11"  (1.803 m)   Body mass index is 21.48 kg/m.   Physical Exam Vitals reviewed.  Constitutional:      Appearance: Normal appearance.  HENT:     Head: Normocephalic.  Eyes:     Extraocular Movements: Extraocular movements intact.     Conjunctiva/sclera: Conjunctivae normal.     Pupils: Pupils are equal, round, and reactive to light.  Cardiovascular:     Rate and Rhythm: Normal rate and regular rhythm.     Pulses: Normal pulses.     Heart sounds: Normal heart sounds.  Pulmonary:     Effort: Pulmonary effort is normal.     Breath sounds: Normal breath sounds.  Musculoskeletal:  Cervical back: Normal range of motion and neck supple.  Skin:    Capillary Refill: Capillary refill takes less than 2 seconds.  Neurological:     General: No focal deficit present.     Mental Status: He is alert and oriented to person, place, and time.  Psychiatric:        Mood and Affect: Mood normal.        Behavior: Behavior normal.    A total of 30 minutes was spent with the patient, greater than 50% of which was in counseling/coordination of care regarding establishing care, review of past medical problems and acute problem list, review of all medications, review of today's cardiologist office visit note, review of most recent blood work results, cardiovascular risks associated with hypertension and dyslipidemia, smoking and risks associated with this, smoking cessation advised, health maintenance items, education on nutrition, documentation, prognosis and need for follow-up.   ASSESSMENT & PLAN: Clinically stable.  No medical concerns identified during this visit except for chronic smoking.  Smoking cessation advice given. Continue present medications.  No changes. Follow-up in 6 months. Justin Odom was seen today for transitions of care.  Diagnoses and all orders for this visit:  Encounter to establish care  Essential  hypertension  Dyslipidemia  History of stroke  Cigarette nicotine dependence without complication    Patient Instructions       If you have lab work done today you will be contacted with your lab results within the next 2 weeks.  If you have not heard from [place] then please contact us. The fastest way to get your results is to register for My Chart.   IF you received an x-ray today, you will receive an invoice from [facility]. Please contact [facility] at [phone] with questions or concerns regarding your invoice.   IF you received labwork today, you will receive an invoice from [facility]. Please contact [facility] at [phone] with questions or concerns regarding your invoice.   Our billing staff will not be able to assist you with questions regarding bills from these companies.  You will be contacted with the lab results as soon as they are available. The fastest way to get your results is to activate your My Chart account. Instructions are located on the last page of this paperwork. If you have not heard from [place] regarding the results in 2 weeks, please contact this office.      Health Maintenance, Male Adopting a healthy lifestyle and getting preventive care are important in promoting health and wellness. Ask your health care provider about:  The right schedule for you to have regular tests and exams.  Things you can do on your own to prevent diseases and keep yourself healthy. What should I know about diet, weight, and exercise? Eat a healthy diet   Eat a diet that includes plenty of vegetables, fruits, low-fat dairy products, and lean protein.  Do not eat a lot of foods that are high in solid fats, added sugars, or sodium. Maintain a healthy weight Body mass index (BMI) is a measurement that can be used to identify possible weight problems. It estimates body fat based on height and weight. Your health care provider can help determine your BMI and  help you achieve or maintain a healthy weight. Get regular exercise Get regular exercise. This is one of the most important things you can do for your health. Most adults should:  Exercise for at least 150 minutes each week. The exercise should increase  your heart rate and make you sweat (moderate-intensity exercise).  Do strengthening exercises at least twice a week. This is in addition to the moderate-intensity exercise.  Spend less time sitting. Even light physical activity can be beneficial. Watch cholesterol and blood lipids Have your blood tested for lipids and cholesterol at 63 years of age, then have this test every 5 years. You may need to have your cholesterol levels checked more often if:  Your lipid or cholesterol levels are high.  You are older than 63 years of age.  You are at high risk for heart disease. What should I know about cancer screening? Many types of cancers can be detected early and may often be prevented. Depending on your health history and family history, you may need to have cancer screening at various ages. This may include screening for:  Colorectal cancer.  Prostate cancer.  Skin cancer.  Lung cancer. What should I know about heart disease, diabetes, and high blood pressure? Blood pressure and heart disease  High blood pressure causes heart disease and increases the risk of stroke. This is more likely to develop in people who have high blood pressure readings, are of African descent, or are overweight.  Talk with your health care provider about your target blood pressure readings.  Have your blood pressure checked: ? Every 3-5 years if you are 75-56 years of age. ? Every year if you are 61 years old or older.  If you are between the ages of 50 and 79 and are a current or former smoker, ask your health care provider if you should have a one-time screening for abdominal aortic aneurysm (AAA). Diabetes Have regular diabetes screenings. This  checks your fasting blood sugar level. Have the screening done:  Once every three years after age 72 if you are at a normal weight and have a low risk for diabetes.  More often and at a younger age if you are overweight or have a high risk for diabetes. What should I know about preventing infection? Hepatitis B If you have a higher risk for hepatitis B, you should be screened for this virus. Talk with your health care provider to find out if you are at risk for hepatitis B infection. Hepatitis C Blood testing is recommended for:  Everyone born from [birth year] through 1964.  Anyone with known risk factors for hepatitis C. Sexually transmitted infections (STIs)  You should be screened each year for STIs, including gonorrhea and chlamydia, if: ? You are sexually active and are younger than 63 years of age. ? You are older than 63 years of age and your health care provider tells you that you are at risk for this type of infection. ? Your sexual activity has changed since you were last screened, and you are at increased risk for chlamydia or gonorrhea. Ask your health care provider if you are at risk.  Ask your health care provider about whether you are at high risk for HIV. Your health care provider may recommend a prescription medicine to help prevent HIV infection. If you choose to take medicine to prevent HIV, you should first get tested for HIV. You should then be tested every 3 months for as long as you are taking the medicine. Follow these instructions at home: Lifestyle  Do not use any products that contain nicotine or tobacco, such as cigarettes, e-cigarettes, and chewing tobacco. If you need help quitting, ask your health care provider.  Do not use street drugs.  Do not  share needles.  Ask your health care provider for help if you need support or information about quitting drugs. Alcohol use  Do not drink alcohol if your health care provider tells you not to drink.  If you drink  alcohol: ? Limit how much you have to 0-2 drinks a day. ? Be aware of how much alcohol is in your drink. In the [place] one drink equals one 12 oz bottle of beer (355 mL), one 5 oz glass of wine (148 mL), or one 1 oz glass of hard liquor (44 mL). General instructions  Schedule regular health, dental, and eye exams.  Stay current with your vaccines.  Tell your health care provider if: ? You often feel depressed. ? You have ever been abused or do not feel safe at home. Summary  Adopting a healthy lifestyle and getting preventive care are important in promoting health and wellness.  Follow your health care provider's instructions about healthy diet, exercising, and getting tested or screened for diseases.  Follow your health care provider's instructions on monitoring your cholesterol and blood pressure. This information is not intended to replace advice given to you by your health care provider. Make sure you discuss any questions you have with your health care provider. Document Revised: 08/27/2018 Document Reviewed: 08/27/2018 Elsevier Patient Education  2020 Elsevier Inc.       Edwina Barth, MD [facility]

## 2020-07-04 NOTE — Patient Instructions (Addendum)
   If you have lab work done today you will be contacted with your lab results within the next 2 weeks.  If you have not heard from us then please contact us. The fastest way to get your results is to register for My Chart.   IF you received an x-ray today, you will receive an invoice from [facility]. Please contact [facility] at [phone] with questions or concerns regarding your invoice.   IF you received labwork today, you will receive an invoice from [facility]. Please contact [facility] at [phone] with questions or concerns regarding your invoice.   Our billing staff will not be able to assist you with questions regarding bills from these companies.  You will be contacted with the lab results as soon as they are available. The fastest way to get your results is to activate your My Chart account. Instructions are located on the last page of this paperwork. If you have not heard from us regarding the results in 2 weeks, please contact this office.      Health Maintenance, Male Adopting a healthy lifestyle and getting preventive care are important in promoting health and wellness. Ask your health care provider about:  The right schedule for you to have regular tests and exams.  Things you can do on your own to prevent diseases and keep yourself healthy. What should I know about diet, weight, and exercise? Eat a healthy diet   Eat a diet that includes plenty of vegetables, fruits, low-fat dairy products, and lean protein.  Do not eat a lot of foods that are high in solid fats, added sugars, or sodium. Maintain a healthy weight Body mass index (BMI) is a measurement that can be used to identify possible weight problems. It estimates body fat based on height and weight. Your health care provider can help determine your BMI and help you achieve or maintain a healthy weight. Get regular exercise Get regular exercise. This is one of the most important things you  can do for your health. Most adults should:  Exercise for at least 150 minutes each week. The exercise should increase your heart rate and make you sweat (moderate-intensity exercise).  Do strengthening exercises at least twice a week. This is in addition to the moderate-intensity exercise.  Spend less time sitting. Even light physical activity can be beneficial. Watch cholesterol and blood lipids Have your blood tested for lipids and cholesterol at 63 years of age, then have this test every 5 years. You may need to have your cholesterol levels checked more often if:  Your lipid or cholesterol levels are high.  You are older than 63 years of age.  You are at high risk for heart disease. What should I know about cancer screening? Many types of cancers can be detected early and may often be prevented. Depending on your health history and family history, you may need to have cancer screening at various ages. This may include screening for:  Colorectal cancer.  Prostate cancer.  Skin cancer.  Lung cancer. What should I know about heart disease, diabetes, and high blood pressure? Blood pressure and heart disease  High blood pressure causes heart disease and increases the risk of stroke. This is more likely to develop in people who have high blood pressure readings, are of African descent, or are overweight.  Talk with your health care provider about your target blood pressure readings.  Have your blood pressure checked: ? Every 3-5 years if you are 18-39   years of age. ? Every year if you are 40 years old or older.  If you are between the ages of 65 and 75 and are a current or former smoker, ask your health care provider if you should have a one-time screening for abdominal aortic aneurysm (AAA). Diabetes Have regular diabetes screenings. This checks your fasting blood sugar level. Have the screening done:  Once every three years after age 45 if you are at a normal weight and have  a low risk for diabetes.  More often and at a younger age if you are overweight or have a high risk for diabetes. What should I know about preventing infection? Hepatitis B If you have a higher risk for hepatitis B, you should be screened for this virus. Talk with your health care provider to find out if you are at risk for hepatitis B infection. Hepatitis C Blood testing is recommended for:  Everyone born from 1945 through 1965.  Anyone with known risk factors for hepatitis C. Sexually transmitted infections (STIs)  You should be screened each year for STIs, including gonorrhea and chlamydia, if: ? You are sexually active and are younger than 63 years of age. ? You are older than 63 years of age and your health care provider tells you that you are at risk for this type of infection. ? Your sexual activity has changed since you were last screened, and you are at increased risk for chlamydia or gonorrhea. Ask your health care provider if you are at risk.  Ask your health care provider about whether you are at high risk for HIV. Your health care provider may recommend a prescription medicine to help prevent HIV infection. If you choose to take medicine to prevent HIV, you should first get tested for HIV. You should then be tested every 3 months for as long as you are taking the medicine. Follow these instructions at home: Lifestyle  Do not use any products that contain nicotine or tobacco, such as cigarettes, e-cigarettes, and chewing tobacco. If you need help quitting, ask your health care provider.  Do not use street drugs.  Do not share needles.  Ask your health care provider for help if you need support or information about quitting drugs. Alcohol use  Do not drink alcohol if your health care provider tells you not to drink.  If you drink alcohol: ? Limit how much you have to 0-2 drinks a day. ? Be aware of how much alcohol is in your drink. In the [place] one drink equals one 12  oz bottle of beer (355 mL), one 5 oz glass of wine (148 mL), or one 1 oz glass of hard liquor (44 mL). General instructions  Schedule regular health, dental, and eye exams.  Stay current with your vaccines.  Tell your health care provider if: ? You often feel depressed. ? You have ever been abused or do not feel safe at home. Summary  Adopting a healthy lifestyle and getting preventive care are important in promoting health and wellness.  Follow your health care provider's instructions about healthy diet, exercising, and getting tested or screened for diseases.  Follow your health care provider's instructions on monitoring your cholesterol and blood pressure. This information is not intended to replace advice given to you by your health care provider. Make sure you discuss any questions you have with your health care provider. Document Revised: 08/27/2018 Document Reviewed: 08/27/2018 Elsevier Patient Education  2020 Elsevier Inc.  

## 2020-07-05 LAB — CBC WITH DIFFERENTIAL/PLATELET
Basophils Absolute: 0 x10E3/uL (ref 0.0–0.2)
Basos: 0 %
EOS (ABSOLUTE): 0.1 x10E3/uL (ref 0.0–0.4)
Eos: 1 %
Hematocrit: 37.8 % (ref 37.5–51.0)
Hemoglobin: 12.4 g/dL — ABNORMAL LOW (ref 13.0–17.7)
Immature Grans (Abs): 0 x10E3/uL (ref 0.0–0.1)
Immature Granulocytes: 0 %
Lymphocytes Absolute: 2.1 x10E3/uL (ref 0.7–3.1)
Lymphs: 23 %
MCH: 31.1 pg (ref 26.6–33.0)
MCHC: 32.8 g/dL (ref 31.5–35.7)
MCV: 95 fL (ref 79–97)
Monocytes Absolute: 0.7 x10E3/uL (ref 0.1–0.9)
Monocytes: 7 %
Neutrophils Absolute: 6.4 x10E3/uL (ref 1.4–7.0)
Neutrophils: 69 %
Platelets: 231 x10E3/uL (ref 150–450)
RBC: 3.99 x10E6/uL — ABNORMAL LOW (ref 4.14–5.80)
RDW: 12 % (ref 11.6–15.4)
WBC: 9.4 x10E3/uL (ref 3.4–10.8)

## 2020-07-05 LAB — HEPATIC FUNCTION PANEL
ALT: 13 IU/L (ref 0–44)
AST: 19 IU/L (ref 0–40)
Albumin: 4.4 g/dL (ref 3.8–4.8)
Alkaline Phosphatase: 84 IU/L (ref 44–121)
Bilirubin Total: 0.3 mg/dL (ref 0.0–1.2)
Bilirubin, Direct: 0.12 mg/dL (ref 0.00–0.40)
Total Protein: 7.6 g/dL (ref 6.0–8.5)

## 2020-07-05 LAB — LIPID PANEL
Chol/HDL Ratio: 3.1 ratio (ref 0.0–5.0)
Cholesterol, Total: 135 mg/dL (ref 100–199)
HDL: 43 mg/dL
LDL Chol Calc (NIH): 80 mg/dL (ref 0–99)
Triglycerides: 53 mg/dL (ref 0–149)
VLDL Cholesterol Cal: 12 mg/dL (ref 5–40)

## 2020-07-05 LAB — BASIC METABOLIC PANEL WITH GFR
BUN/Creatinine Ratio: 9 — ABNORMAL LOW (ref 10–24)
BUN: 10 mg/dL (ref 8–27)
CO2: 24 mmol/L (ref 20–29)
Calcium: 9.5 mg/dL (ref 8.6–10.2)
Chloride: 103 mmol/L (ref 96–106)
Creatinine, Ser: 1.07 mg/dL (ref 0.76–1.27)
GFR calc Af Amer: 86 mL/min/1.73
GFR calc non Af Amer: 74 mL/min/1.73
Glucose: 100 mg/dL — ABNORMAL HIGH (ref 65–99)
Potassium: 5 mmol/L (ref 3.5–5.2)
Sodium: 138 mmol/L (ref 134–144)

## 2020-07-05 LAB — TSH: TSH: 1.76 u[IU]/mL (ref 0.450–4.500)

## 2020-08-15 ENCOUNTER — Ambulatory Visit: Payer: 59 | Admitting: Emergency Medicine

## 2020-11-07 ENCOUNTER — Ambulatory Visit
Admission: RE | Admit: 2020-11-07 | Discharge: 2020-11-07 | Disposition: A | Discharge status: Home / Self Care | Payer: 59 | Source: Ambulatory Visit | Attending: Emergency Medicine | Admitting: Emergency Medicine

## 2020-11-07 ENCOUNTER — Other Ambulatory Visit: Payer: Self-pay

## 2020-11-07 VITALS — BP 151/84 | HR 69 | Temp 97.8°F | Resp 18

## 2020-11-07 DIAGNOSIS — L739 Follicular disorder, unspecified: Secondary | ICD-10-CM

## 2020-11-07 NOTE — Discharge Instructions (Addendum)
You have probable folliculitis. Treat with warm moist compresses as we discussed severe times a day. You do not need antibiotics at this point unless it gets bigger or you began to have fevers. If so please f/u

## 2020-11-07 NOTE — ED Provider Notes (Signed)
[facility]    CSN: [id number] Arrival date & time: 11/07/20  1447      History   Chief Complaint Chief Complaint  Patient presents with  . appt 3  . Abscess  . Facial Pain    HPI Justin Odom. is a 65 y.o. male.   Who presents with a "sore" of his right ear. Onset a few days. No drainage. Some swelling he believes. He also reports a runny nose for 2 days, but states the nettie pot helps his symptoms. He has no fevers, cough or congestion.      Past Medical History:  Diagnosis Date  . Borderline high blood pressure 01/10/2015  . Colon cancer screening 10/24/2015  . CVA (cerebral infarction)   . Dependence on nicotine from other tobacco product 10/24/2015  . 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  Marland Kitchen Dyslipidemia 05/02/2015  . Essential hypertension 02/02/2019  . H/O TIA (transient ischemic attack) and stroke 11/29/2014  . Hyperlipidemia   . Premature heartbeats 09/28/2019  . Stroke (HCC) 11/25/2014  . Tobacco abuse 02/18/2017    Patient Active Problem List   Diagnosis Date Noted  . 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 Hyperlipidemia 10/05/2019  . Premature heartbeats 09/28/2019  . Essential hypertension 02/02/2019  . Tobacco abuse 02/18/2017  . 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 Dependence on nicotine from other tobacco product 10/24/2015  . Dyslipidemia 05/02/2015  . H/O TIA (transient ischemic attack) and stroke 11/29/2014    Past Surgical History:  Procedure Laterality Date  . NO PAST SURGERIES         Home Medications    Prior to Admission medications   Medication Sig Start Date End Date Taking? Authorizing Provider  aspirin EC 81 MG tablet Take 81 mg by mouth daily.    [provider]  atorvastatin (LIPITOR) 40 MG tablet Take 1 tablet (40 mg total) by mouth daily.  12/21/19   Revankar, Aundra Dubin, MD  cholecalciferol (VITAMIN D3) 25 MCG (1000 UNIT) tablet Take 1,000 Units by mouth daily.    [provider]  metoprolol succinate (TOPROL-XL) 50 MG 24 hr tablet Take 1 tablet (50 mg total) by mouth daily. Take with or immediately following a meal. 12/21/19   Revankar, Aundra Dubin, MD    Family History Family History  Problem Relation Age of Onset  . Cancer Mother   . Pancreatic cancer Mother   . Cancer Sister   . Colon cancer Neg Hx     Social History Social History   Tobacco Use  . Smoking status: Current Some Day Smoker    Packs/day: 0.25    Types: Cigarettes    Last attempt to quit: 09/24/2019    Years since quitting: 1.1  . Smokeless tobacco: Never Used  . Tobacco comment: 3 cigarettes a day  Substance Use Topics  . Alcohol use: Yes    Alcohol/week: 0.0 standard drinks    Comment: occ  . Drug use: Yes    Types: Marijuana     Allergies   Patient has no known allergies.   Review of Systems Review of Systems  Constitutional: Negative for fever.  HENT: Positive for ear pain and rhinorrhea. Negative for sinus pressure, sinus pain, sneezing and sore throat.   Eyes: Negative.   Respiratory: Negative for cough and shortness of breath.   Skin: Negative.  Allergic/Immunologic: Negative for environmental allergies.  Psychiatric/Behavioral: Negative.      Physical Exam Triage Vital Signs ED Triage Vitals  Enc Vitals Group     BP 11/07/20 1520 (!) 151/84     Pulse Rate 11/07/20 1520 69     Resp 11/07/20 1520 18     Temp 11/07/20 1520 97.8 F (36.6 C)     Temp Source 11/07/20 1520 Oral     SpO2 11/07/20 1520 97 %     Weight --      Height --      Head Circumference --      Peak Flow --      Pain Score 11/07/20 1521 5     Pain Loc --      Pain Edu? --      Excl. in GC? --    No data found.  Updated Vital Signs BP (!) 151/84 (BP Location: Left Arm)   Pulse 69   Temp 97.8 F (36.6 C) (Oral)   Resp 18   SpO2 97%    Visual Acuity Right Eye Distance:   Left Eye Distance:   Bilateral Distance:    Right Eye Near:   Left Eye Near:    Bilateral Near:     Physical Exam Vitals and nursing note reviewed.  Constitutional:      General: He is not in acute distress.    Appearance: Normal appearance. He is obese. He is not ill-appearing, toxic-appearing or diaphoretic.  HENT:     Head: Normocephalic and atraumatic.     Right Ear: There is no impacted cerumen.     Ears:     Comments: Right small papule to pinna, sore to palpation. Mild swelling and not flunctunat. Ear canal is normal. No erythema or warmth is noted    Nose: Nose normal. No congestion or rhinorrhea.  Cardiovascular:     Rate and Rhythm: Normal rate and regular rhythm.  Pulmonary:     Effort: Pulmonary effort is normal.     Breath sounds: No wheezing or rhonchi.  Musculoskeletal:     Cervical back: Normal range of motion.  Skin:    General: Skin is warm and dry.     Findings: No erythema or rash.     Comments: See ear exam  Neurological:     General: No focal deficit present.     Mental Status: He is alert.  Psychiatric:        Mood and Affect: Mood normal.        Behavior: Behavior normal.      UC Treatments / Results  Labs (all labs ordered are listed, but only abnormal results are displayed) Labs Reviewed - No data to display  EKG   Radiology No results found.  Procedures Procedures (including critical care time)  Medications Ordered in UC Medications - No data to display  Initial Impression / Assessment and Plan / UC Course  I have reviewed the triage vital signs and the nursing notes.  Pertinent labs & imaging results that were available during my care of the patient were reviewed by me and considered in my medical decision making (see chart for details).     Most likely folliculitis along the pinna of the right ear. No abscess. Apply warm compresses to area. No indication for an antibiotic at this  time. Continue with nettie pot for rhinorrhea, no evidence of a sinus infection. FU if needed.  Final Clinical Impressions(s) / UC Diagnoses   Final diagnoses:  None   Discharge Instructions   None    ED Prescriptions    None     PDMP not reviewed this encounter.   Riki Sheer, PA-C 11/07/20 1659

## 2020-11-07 NOTE — ED Triage Notes (Signed)
Pt c/o a sore inside rt ear x3 days. Pt c/o sinus pressure and runny nose x2 days.

## 2020-12-08 ENCOUNTER — Telehealth: Payer: Self-pay | Admitting: Cardiology

## 2020-12-08 NOTE — Telephone Encounter (Signed)
Patient wanted to inform Dr. Tomie China he was not able to get an appointment until 03/13/2021 for his 6 month follow up. Patient requested a Monday and this was the soonest available. He states he does not need a call back, but if a nurse needs to call him that is fine.

## 2020-12-21 ENCOUNTER — Other Ambulatory Visit: Payer: Self-pay | Admitting: Cardiology

## 2020-12-22 ENCOUNTER — Other Ambulatory Visit: Payer: Self-pay | Admitting: Cardiology

## 2021-02-10 ENCOUNTER — Encounter (HOSPITAL_COMMUNITY): Payer: Self-pay

## 2021-02-10 ENCOUNTER — Emergency Department (HOSPITAL_COMMUNITY)
Admission: EM | Admit: 2021-02-10 | Discharge: 2021-02-10 | Disposition: A | Discharge status: Home / Self Care | Payer: 59 | Attending: Emergency Medicine | Admitting: Emergency Medicine

## 2021-02-10 ENCOUNTER — Other Ambulatory Visit: Payer: Self-pay

## 2021-02-10 DIAGNOSIS — Z8673 Personal history of transient ischemic attack (TIA), and cerebral infarction without residual deficits: Secondary | ICD-10-CM | POA: Diagnosis not present

## 2021-02-10 DIAGNOSIS — Z7982 Long term (current) use of aspirin: Secondary | ICD-10-CM | POA: Diagnosis not present

## 2021-02-10 DIAGNOSIS — R319 Hematuria, unspecified: Secondary | ICD-10-CM | POA: Diagnosis present

## 2021-02-10 DIAGNOSIS — Z79899 Other long term (current) drug therapy: Secondary | ICD-10-CM | POA: Diagnosis not present

## 2021-02-10 DIAGNOSIS — N3090 Cystitis, unspecified without hematuria: Secondary | ICD-10-CM | POA: Insufficient documentation

## 2021-02-10 DIAGNOSIS — F17210 Nicotine dependence, cigarettes, uncomplicated: Secondary | ICD-10-CM | POA: Insufficient documentation

## 2021-02-10 DIAGNOSIS — I10 Essential (primary) hypertension: Secondary | ICD-10-CM | POA: Insufficient documentation

## 2021-02-10 LAB — URINALYSIS, ROUTINE W REFLEX MICROSCOPIC
Bilirubin Urine: NEGATIVE
Glucose, UA: NEGATIVE mg/dL
Ketones, ur: NEGATIVE mg/dL
Nitrite: NEGATIVE
Protein, ur: 30 mg/dL — AB
Specific Gravity, Urine: 1.023 (ref 1.005–1.030)
WBC, UA: >50 WBC/hpf — ABNORMAL HIGH (ref 0–5)
pH: 6 (ref 5.0–8.0)

## 2021-02-10 MED ORDER — CIPROFLOXACIN HCL 500 MG PO TABS: 500.0000 mg | ORAL_TABLET | Freq: Two times a day (BID) | ORAL | 0 refills | Status: AC

## 2021-02-10 NOTE — ED Provider Notes (Signed)
[facility]-EMERGENCY DEPT Provider Note   CSN: [id number] Arrival date & time: 02/10/21  1002     History Chief Complaint  Patient presents with  . Hematuria  . Back Pain    Justin Helmers. is a 65 y.o. male.  HPI Patient is a 65 year old male with a past medical history detailed below presented today for hematuria for the past 24 hours.  He states he is actually not seen any blood in his urine but states that his urine has been darker over the past 24 hours.  Denies any urinary frequency urgency dysuria.  Does not see any blood or clots in his urine.  He denies any penile discharge.  He was recently treated for trichomonas because his wife was diagnosed with this.  He denies any abdominal pain nausea vomiting or any symptoms currently.  He states that when he is sitting in his sofa he slouches and feels achy in his low back.  He states he has no back pain currently and states that it only occurs when he is sitting on his couch.  He denies any other aggravating factors and states that the only mitigating factor is getting out of the couch which causes his symptoms to improve.      Past Medical History:  Diagnosis Date  . Borderline high blood pressure 01/10/2015  . Colon cancer screening 10/24/2015  . CVA (cerebral infarction)   . Dependence on nicotine from other tobacco product 10/24/2015  . 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  Marland Kitchen Dyslipidemia 05/02/2015  . Essential hypertension 02/02/2019  . H/O TIA (transient ischemic attack) and stroke 11/29/2014  . Hyperlipidemia   . Premature heartbeats 09/28/2019  . Stroke (HCC) 11/25/2014  . Tobacco abuse 02/18/2017    Patient Active Problem List   Diagnosis Date Noted  . 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 Hyperlipidemia 10/05/2019  . Premature heartbeats 09/28/2019  . Essential  hypertension 02/02/2019  . Tobacco abuse 02/18/2017  . 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 Dependence on nicotine from other tobacco product 10/24/2015  . Dyslipidemia 05/02/2015  . H/O TIA (transient ischemic attack) and stroke 11/29/2014    Past Surgical History:  Procedure Laterality Date  . NO PAST SURGERIES         Family History  Problem Relation Age of Onset  . Cancer Mother   . Pancreatic cancer Mother   . Cancer Sister   . Colon cancer Neg Hx     Social History   Tobacco Use  . Smoking status: Current Some Day Smoker    Packs/day: 0.25    Types: Cigarettes    Last attempt to quit: 09/24/2019    Years since quitting: 1.3  . Smokeless tobacco: Never Used  . Tobacco comment: 3 cigarettes a day  Vaping Use  . Vaping Use: Never used  Substance Use Topics  . Alcohol use: Yes    Alcohol/week: 0.0 standard drinks    Comment: occ  . Drug use: Yes    Types: Marijuana    Home Medications Prior to Admission medications   Medication Sig Start Date End Date Taking? Authorizing Provider  ciprofloxacin (CIPRO) 500 MG tablet Take 1 tablet (500 mg total) by mouth 2 (two) times daily for 14 days. 02/10/21 02/24/21 Yes Gailen Shelter, PA  aspirin EC 81 MG tablet Take 81 mg by  mouth daily.    [provider]  atorvastatin (LIPITOR) 40 MG tablet TAKE 1 TABLET BY MOUTH EVERY DAY 12/21/20   Revankar, Aundra Dubin, MD  cholecalciferol (VITAMIN D3) 25 MCG (1000 UNIT) tablet Take 1,000 Units by mouth daily.    [provider]  metoprolol succinate (TOPROL-XL) 50 MG 24 hr tablet TAKE 1 TABLET BY MOUTH DAILY. TAKE WITH OR IMMEDIATELY FOLLOWING A MEAL. 12/22/20   Revankar, Aundra Dubin, MD    Allergies    Patient has no known allergies.  Review of Systems   Review of Systems  Constitutional: Negative for chills and fever.  HENT: Negative for congestion.   Respiratory: Negative for shortness of breath.   Cardiovascular: Negative for chest pain.   Gastrointestinal: Negative for abdominal pain.  Genitourinary:       Dark urine  Musculoskeletal: Positive for back pain. Negative for neck pain.    Physical Exam Updated Vital Signs BP 116/79 (BP Location: Right Arm)   Pulse 73   Temp 97.9 F (36.6 C) (Oral)   Resp 17   Ht 5\' 11"  (1.803 m)   Wt 71.2 kg   SpO2 98%   BMI 21.90 kg/m   Physical Exam Vitals and nursing note reviewed.  Constitutional:      General: He is not in acute distress.    Comments: Pleasant well-appearing 65 year old.  In no acute distress.  Sitting comfortably in bed.  Able answer questions appropriately follow commands. No increased work of breathing. Speaking in full sentences.  HENT:     Head: Normocephalic and atraumatic.     Nose: Nose normal.  Eyes:     General: No scleral icterus. Cardiovascular:     Rate and Rhythm: Normal rate and regular rhythm.     Pulses: Normal pulses.     Heart sounds: Normal heart sounds.  Pulmonary:     Effort: Pulmonary effort is normal. No respiratory distress.     Breath sounds: No wheezing.  Abdominal:     Palpations: Abdomen is soft.     Tenderness: There is no abdominal tenderness. There is no guarding or rebound.  Musculoskeletal:     Cervical back: Normal range of motion.     Right lower leg: No edema.     Left lower leg: No edema.     Comments: No C, T, L-spine tenderness to palpation.  Full range of motion of spine.  No tenderness palpation of paralumbar musculature.  Skin:    General: Skin is warm and dry.     Capillary Refill: Capillary refill takes less than 2 seconds.  Neurological:     Mental Status: He is alert. Mental status is at baseline.  Psychiatric:        Mood and Affect: Mood normal.        Behavior: Behavior normal.     ED Results / Procedures / Treatments   Labs (all labs ordered are listed, but only abnormal results are displayed) Labs Reviewed  URINALYSIS, ROUTINE W REFLEX MICROSCOPIC - Abnormal; Notable for the following  components:      Result Value   Color, Urine AMBER (*)    APPearance HAZY (*)    Hgb urine dipstick MODERATE (*)    Protein, ur 30 (*)    Leukocytes,Ua LARGE (*)    WBC, UA >50 (*)    Bacteria, UA MANY (*)    Non Squamous Epithelial 0-5 (*)    All other components within normal limits  URINE CULTURE  EKG None  Radiology No results found.  Procedures Procedures   Medications Ordered in ED Medications - No data to display  ED Course  I have reviewed the triage vital signs and the nursing notes.  Pertinent labs & imaging results that were available during my care of the patient were reviewed by me and considered in my medical decision making (see chart for details).    MDM Rules/Calculators/A&P                          Patient is a 65 year old male being treated currently for trichomonas noticed some dark urine today he is several days into his antibiotic/Flagyl.  He denies any urinary urgency frequency or dysuria no penile discharge or abdominal pain.  He does state that his urine has been darker recently.  His urinalysis shows many bacteria with greater than 50 WBC with large leukocytes, protein, hemoglobin present.  He is not having any back pain presently the back pain that he is describing to me only occurs when he is slouching in a couch at his house that does not state his proportions he states.  I have very low suspicion for nephrolithiasis.  Suspect prostatitis versus perhaps trichomonas cystitis ultimately will treat with ciprofloxacin for 14 days and recommend close follow-up with PCP to lengthen treatment to 28 days if symptoms continue.  Urine culture sent to lab.  Ciprofloxacin seems like an appropriate antibiotic for this patient given that I have high suspicion for prostatitis and given that he is a male and elderly he qualifies as a complicated cystitis.  He is systemically well-appearing with normal vital signs he is afebrile and denies any symptoms at all.  He  understands the need for close follow-up with PCP.  He states that he can be seen in the next week.  Final Clinical Impression(s) / ED Diagnoses Final diagnoses:  Cystitis    Rx / DC Orders ED Discharge Orders         Ordered    ciprofloxacin (CIPRO) 500 MG tablet  2 times daily        02/10/21 1200           Solon Augusta Linn, [place] 02/10/21 1703    Cheryll Cockayne, MD 02/11/21 2001

## 2021-02-10 NOTE — ED Triage Notes (Addendum)
Patient reports that him and his wife are taking antibiotics for trich.  Patient states he noticed that his urine was real dark and is not sure if it was blood or not.  Patient also c/o right lower back pain x 1 week.

## 2021-02-10 NOTE — Discharge Instructions (Signed)
You have evidence of urinary tract infection.  This very well may be related to the trichomonas you are being treated for however I will go ahead and began treating him with an antibiotic that you will take in addition to your current treatment.  It is often the case with men over 68 that urinary tract infections are in fact prostatitis.  I would like you to follow-up with your primary care doctor in the next week or 2.  If your symptoms have continued your primary care provider can follow-up on your urine culture results and see if any changes to your medications are required to treat the infection.

## 2021-02-12 LAB — URINE CULTURE: Culture: 50000 — AB

## 2021-02-13 ENCOUNTER — Telehealth: Payer: Self-pay | Admitting: Emergency Medicine

## 2021-02-13 NOTE — Telephone Encounter (Signed)
Post ED Visit - Positive Culture Follow-up  Culture report reviewed by antimicrobial stewardship pharmacist: [facility] []  , Pharm.D. []  Enzo Bi, Pharm.D., BCPS AQ-ID []  , Pharm.D., BCPS []  Celedonio Miyamoto, .D., BCPS []  [place], .D., BCPS, AAHIVP []  Georgina Pillion, Pharm.D., BCPS, AAHIVP []  [address], PharmD, BCPS []  , PharmD, BCPS []  [place], PharmD, BCPS []  [address], PharmD []  , PharmD, BCPS []  Estella Husk, PharmD  Pharmacy Team []  Lysle Pearl, PharmD []  , PharmD []  Phillips Climes, PharmD []  , Rph []  Agapito Games) , PharmD []  Verlan Friends, PharmD []  , PharmD []  Mervyn Gay, PharmD []  , PharmD []  Vinnie Level, PharmD []  [facility], PharmD []  , PharmD []  Len Childs, PharmD PharmD   Positive urine culture Treated with ciprofloxacin, organism sensitive to the same and no further patient follow-up is required at this time.  Greer Pickerel 02/13/2021, 10:00 AM

## 2021-02-22 ENCOUNTER — Ambulatory Visit: Payer: 59 | Admitting: Emergency Medicine

## 2021-02-22 ENCOUNTER — Encounter: Payer: Self-pay | Admitting: Emergency Medicine

## 2021-02-22 ENCOUNTER — Other Ambulatory Visit: Payer: Self-pay

## 2021-02-22 VITALS — BP 138/80 | HR 64 | Temp 98.4°F | Ht 71.0 in | Wt 152.0 lb

## 2021-02-22 DIAGNOSIS — F17200 Nicotine dependence, unspecified, uncomplicated: Secondary | ICD-10-CM | POA: Diagnosis not present

## 2021-02-22 DIAGNOSIS — E785 Hyperlipidemia, unspecified: Secondary | ICD-10-CM | POA: Diagnosis not present

## 2021-02-22 DIAGNOSIS — Z87440 Personal history of urinary (tract) infections: Secondary | ICD-10-CM | POA: Diagnosis not present

## 2021-02-22 DIAGNOSIS — I10 Essential (primary) hypertension: Secondary | ICD-10-CM

## 2021-02-22 DIAGNOSIS — Z8673 Personal history of transient ischemic attack (TIA), and cerebral infarction without residual deficits: Secondary | ICD-10-CM

## 2021-02-22 HISTORY — DX: Personal history of urinary (tract) infections: Z87.440

## 2021-02-22 LAB — POCT URINALYSIS DIP (MANUAL ENTRY)
Bilirubin, UA: NEGATIVE
Blood, UA: NEGATIVE
Glucose, UA: NEGATIVE mg/dL
Ketones, POC UA: NEGATIVE mg/dL
Leukocytes, UA: NEGATIVE
Nitrite, UA: NEGATIVE
Protein Ur, POC: NEGATIVE mg/dL
Spec Grav, UA: 1.025 (ref 1.010–1.025)
Urobilinogen, UA: 0.2 E.U./dL
pH, UA: 6 (ref 5.0–8.0)

## 2021-02-22 MED ORDER — ATORVASTATIN CALCIUM 40 MG PO TABS: 1.0000 | ORAL_TABLET | Freq: Every day | ORAL | 3 refills | Status: DC

## 2021-02-22 NOTE — Assessment & Plan Note (Signed)
Secondary prevention discussed.  Continue baby aspirin daily. Continue atorvastatin 40 mg daily.  Encouraged to stop smoking. Blood pressure under control.

## 2021-02-22 NOTE — Progress Notes (Signed)
Justin Odom. 65 y.o.   Chief Complaint  Patient presents with  . Hospitalization Follow-up    Cystitis  . Medication Refill    Atorvastatin    HISTORY OF PRESENT ILLNESS: This is a 65 y.o. male here for follow-up of emergency room visit. Summary of ED visit as follows: Patient is a 65 year old male being treated currently for trichomonas noticed some dark urine today he is several days into his antibiotic/Flagyl.  He denies any urinary urgency frequency or dysuria no penile discharge or abdominal pain.  He does state that his urine has been darker recently.  His urinalysis shows many bacteria with greater than 50 WBC with large leukocytes, protein, hemoglobin present.  He is not having any back pain presently the back pain that he is describing to me only occurs when he is slouching in a couch at his house that does not state his proportions he states.  I have very low suspicion for nephrolithiasis.  Suspect prostatitis versus perhaps trichomonas cystitis ultimately will treat with ciprofloxacin for 14 days and recommend close follow-up with PCP to lengthen treatment to 28 days if symptoms continue.  Urine culture sent to lab.   Ciprofloxacin seems like an appropriate antibiotic for this patient given that I have high suspicion for prostatitis and given that he is a male and elderly he qualifies as a complicated cystitis.  He is systemically well-appearing with normal vital signs he is afebrile and denies any symptoms at all. 02/10/2021 Urine culture grew E. coli sensitive to Cipro but resistant to amoxicillin. Much better today.  Asymptomatic.  No urinary symptoms. Has history of hypertension presently on metoprolol succinate 50 mg daily. Also takes 1 baby daily aspirin and atorvastatin 40 mg daily. Smoking much less than before about 2 to 3 cigarettes/day  HPI   Prior to Admission medications   Medication Sig Start Date End Date Taking? Authorizing Provider  aspirin EC 81 MG  tablet Take 81 mg by mouth daily.   Yes [provider]  atorvastatin (LIPITOR) 40 MG tablet TAKE 1 TABLET BY MOUTH EVERY DAY 12/21/20  Yes Revankar, Aundra Dubin, MD  cholecalciferol (VITAMIN D3) 25 MCG (1000 UNIT) tablet Take 1,000 Units by mouth daily.   Yes [provider]  ciprofloxacin (CIPRO) 500 MG tablet Take 1 tablet (500 mg total) by mouth 2 (two) times daily for 14 days. 02/10/21 02/24/21 Yes Fondaw, Wylder S, PA  metoprolol succinate (TOPROL-XL) 50 MG 24 hr tablet TAKE 1 TABLET BY MOUTH DAILY. TAKE WITH OR IMMEDIATELY FOLLOWING A MEAL. 12/22/20  Yes Revankar, Aundra Dubin, MD    No Known Allergies  Patient Active Problem List   Diagnosis Date Noted  . 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 Hyperlipidemia 10/05/2019  . Premature heartbeats 09/28/2019  . Essential hypertension 02/02/2019  . Tobacco abuse 02/18/2017  . 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 Dependence on nicotine from other tobacco product 10/24/2015  . Dyslipidemia 05/02/2015  . H/O TIA (transient ischemic attack) and stroke 11/29/2014    Past Medical History:  Diagnosis Date  . Borderline high blood pressure 01/10/2015  . Colon cancer screening 10/24/2015  . CVA (cerebral infarction)   . Dependence on nicotine from other tobacco product 10/24/2015  . 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  Marland Kitchen Dyslipidemia 05/02/2015  . Essential hypertension 02/02/2019  . H/O TIA (transient ischemic  attack) and stroke 11/29/2014  . Hyperlipidemia   . Premature heartbeats 09/28/2019  . Stroke (HCC) 11/25/2014  . Tobacco abuse 02/18/2017    Past Surgical History:  Procedure Laterality Date  . NO PAST SURGERIES      Social History   Socioeconomic History  . Marital status: Divorced    Spouse name: Not on file  . Number of children: Not on file  . Years of  education: Not on file  . Highest education level: Not on file  Occupational History  . Not on file  Tobacco Use  . Smoking status: Current Some Day Smoker    Packs/day: 0.25    Types: Cigarettes    Last attempt to quit: 09/24/2019    Years since quitting: 1.4  . Smokeless tobacco: Never Used  . Tobacco comment: 3 cigarettes a day  Vaping Use  . Vaping Use: Never used  Substance and Sexual Activity  . Alcohol use: Yes    Alcohol/week: 0.0 standard drinks    Comment: occ  . Drug use: Yes    Types: Marijuana  . Sexual activity: Not on file  Other Topics Concern  . Not on file  Social History Narrative  . Not on file   Social Determinants of Health   Financial Resource Strain: Not on file  Food Insecurity: Not on file  Transportation Needs: Not on file  Physical Activity: Not on file  Stress: Not on file  Social Connections: Not on file  Intimate Partner Violence: Not on file    Family History  Problem Relation Age of Onset  . Cancer Mother   . Pancreatic cancer Mother   . Cancer Sister   . Colon cancer Neg Hx      Review of Systems  Constitutional: Negative.   HENT: Negative.   Respiratory: Negative.  Negative for shortness of breath.   Cardiovascular: Negative.  Negative for palpitations.  Gastrointestinal: Negative.  Negative for diarrhea.  Genitourinary: Negative.  Negative for dysuria and hematuria.  Musculoskeletal: Negative.  Negative for back pain.  Skin: Negative.   Neurological: Negative.  Negative for dizziness.  All other systems reviewed and are negative.  Today's Vitals   02/22/21 1440  BP: 138/80  Pulse: 64  Temp: 98.4 F (36.9 C)  TempSrc: Oral  Weight: 152 lb (68.9 kg)  Height: 5\' 11"  (1.803 m)   Body mass index is 21.2 kg/m.   Physical Exam Vitals reviewed.  Constitutional:      Appearance: Normal appearance.  HENT:     Head: Normocephalic.  Eyes:     Extraocular Movements: Extraocular movements intact.      Conjunctiva/sclera: Conjunctivae normal.     Pupils: Pupils are equal, round, and reactive to light.  Cardiovascular:     Rate and Rhythm: Normal rate and regular rhythm.     Pulses: Normal pulses.     Heart sounds: Normal heart sounds.  Pulmonary:     Effort: Pulmonary effort is normal.     Breath sounds: Normal breath sounds.  Musculoskeletal:        General: Normal range of motion.     Cervical back: Normal range of motion and neck supple.  Skin:    General: Skin is warm and dry.     Capillary Refill: Capillary refill takes less than 2 seconds.  Neurological:     General: No focal deficit present.     Mental Status: He is alert and oriented to person, place, and time.  Psychiatric:  Mood and Affect: Mood normal.        Behavior: Behavior normal.    Results for orders placed or performed in visit on 02/22/21 (from the past 24 hour(s))  POCT urinalysis dipstick     Status: None   Collection Time: 02/22/21  3:10 PM  Result Value Ref Range   Color, UA yellow yellow   Clarity, UA clear clear   Glucose, UA negative negative mg/dL   Bilirubin, UA negative negative   Ketones, POC UA negative negative mg/dL   Spec Grav, UA 1.610 9.604 - 1.025   Blood, UA negative negative   pH, UA 6.0 5.0 - 8.0   Protein Ur, POC negative negative mg/dL   Urobilinogen, UA 0.2 0.2 or 1.0 E.U./dL   Nitrite, UA Negative Negative   Leukocytes, UA Negative Negative    ASSESSMENT & PLAN: A total of 30 minutes was spent with the patient and counseling/coordination of care regarding recent urinary tract infection and review of emergency department visit, review of urine culture results, hypertension and its management and cardiovascular risks associated with this condition, smoking and cardiovascular lung cancer risk associated with this, smoking cessation advised, review of most recent office visit notes, review of most recent blood work and urine results, health maintenance items, prognosis,  documentation and need for follow-up.  Essential hypertension Well-controlled hypertension.  Continue metoprolol succinate 50 mg daily. Dietary approach to control hypertension discussed.  Current smoker Smoking much less than before.  Smoking cessation advise given.  Dyslipidemia Diet and nutrition discussed.  Continue atorvastatin 40 mg daily.  H/O TIA (transient ischemic attack) and stroke Secondary prevention discussed.  Continue baby aspirin daily. Continue atorvastatin 40 mg daily.  Encouraged to stop smoking. Blood pressure under control.  Recent urinary tract infection Clinically 100% improved.  Will finish antibiotic in 2 days, Cipro 500 mg twice a day. Repeat urine culture done today.  Edwards was seen today for hospitalization follow-up and medication refill.  Diagnoses and all orders for this visit:  Essential hypertension  Dyslipidemia  Current smoker  Recent urinary tract infection -     Urine Culture -     POCT urinalysis dipstick  H/O TIA (transient ischemic attack) and stroke  Other orders -     atorvastatin (LIPITOR) 40 MG tablet; Take 1 tablet (40 mg total) by mouth daily.    Patient Instructions   Preventive Care 21-23 Years Old, Male Preventive care refers to lifestyle choices and visits with your health care provider that can promote health and wellness. This includes:  A yearly physical exam. This is also called an annual wellness visit.  Regular dental and eye exams.  Immunizations.  Screening for certain conditions.  Healthy lifestyle choices, such as: ? Eating a healthy diet. ? Getting regular exercise. ? Not using drugs or products that contain nicotine and tobacco. ? Limiting alcohol use. What can I expect for my preventive care visit? Physical exam Your health care provider will check your:  Height and weight. These may be used to calculate your BMI (body mass index). BMI is a measurement that tells if you are at a healthy  weight.  Heart rate and blood pressure.  Body temperature.  Skin for abnormal spots. Counseling Your health care provider may ask you questions about your:  Past medical problems.  Family's medical history.  Alcohol, tobacco, and drug use.  Emotional well-being.  Home life and relationship well-being.  Sexual activity.  Diet, exercise, and sleep habits.  Work and work  environment.  Access to firearms. What immunizations do I need? Vaccines are usually given at various ages, according to a schedule. Your health care provider will recommend vaccines for you based on your age, medical history, and lifestyle or other factors, such as travel or where you work.   What tests do I need? Blood tests  Lipid and cholesterol levels. These may be checked every 5 years, or more often if you are over 5 years old.  Hepatitis C test.  Hepatitis B test. Screening  Lung cancer screening. You may have this screening every year starting at age 30 if you have a 30-pack-year history of smoking and currently smoke or have quit within the past 15 years.  Prostate cancer screening. Recommendations will vary depending on your family history and other risks.  Genital exam to check for testicular cancer or hernias.  Colorectal cancer screening. ? All adults should have this screening starting at age 6 and continuing until age 77. ? Your health care provider may recommend screening at age 61 if you are at increased risk. ? You will have tests every 1-10 years, depending on your results and the type of screening test.  Diabetes screening. ? This is done by checking your blood sugar (glucose) after you have not eaten for a while (fasting). ? You may have this done every 1-3 years.  STD (sexually transmitted disease) testing, if you are at risk. Follow these instructions at home: Eating and drinking  Eat a diet that includes fresh fruits and vegetables, whole grains, lean protein, and  low-fat dairy products.  Take vitamin and mineral supplements as recommended by your health care provider.  Do not drink alcohol if your health care provider tells you not to drink.  If you drink alcohol: ? Limit how much you have to 0-2 drinks a day. ? Be aware of how much alcohol is in your drink. In the [place] one drink equals one 12 oz bottle of beer (355 mL), one 5 oz glass of wine (148 mL), or one 1 oz glass of hard liquor (44 mL).   Lifestyle  Take daily care of your teeth and gums. Brush your teeth every morning and night with fluoride toothpaste. Floss one time each day.  Stay active. Exercise for at least 30 minutes 5 or more days each week.  Do not use any products that contain nicotine or tobacco, such as cigarettes, e-cigarettes, and chewing tobacco. If you need help quitting, ask your health care provider.  Do not use drugs.  If you are sexually active, practice safe sex. Use a condom or other form of protection to prevent STIs (sexually transmitted infections).  If told by your health care provider, take low-dose aspirin daily starting at age 59.  Find healthy ways to cope with stress, such as: ? Meditation, yoga, or listening to music. ? Journaling. ? Talking to a trusted person. ? Spending time with friends and family. Safety  Always wear your seat belt while driving or riding in a vehicle.  Do not drive: ? If you have been drinking alcohol. Do not ride with someone who has been drinking. ? When you are tired or distracted. ? While texting.  Wear a helmet and other protective equipment during sports activities.  If you have firearms in your house, make sure you follow all gun safety procedures. What's next?  Go to your health care provider once a year for an annual wellness visit.  Ask your health care provider how often  you should have your eyes and teeth checked.  Stay up to date on all vaccines. This information is not intended to replace advice  given to you by your health care provider. Make sure you discuss any questions you have with your health care provider. Document Revised: 06/02/2019 Document Reviewed: 08/28/2018 Elsevier Patient Education  2021 Elsevier Inc.     Edwina Barth, MD [facility]

## 2021-02-22 NOTE — Assessment & Plan Note (Signed)
Diet and nutrition discussed. Continue atorvastatin 40 mg daily. 

## 2021-02-22 NOTE — Patient Instructions (Signed)

## 2021-02-22 NOTE — Assessment & Plan Note (Signed)
Clinically 100% improved.  Will finish antibiotic in 2 days, Cipro 500 mg twice a day. Repeat urine culture done today.

## 2021-02-22 NOTE — Assessment & Plan Note (Signed)
Smoking much less than before.  Smoking cessation advise given.

## 2021-02-22 NOTE — Assessment & Plan Note (Signed)
Well-controlled hypertension.  Continue metoprolol succinate 50 mg daily. Dietary approach to control hypertension discussed.

## 2021-02-23 LAB — URINE CULTURE: Result:: NO GROWTH

## 2021-03-09 ENCOUNTER — Other Ambulatory Visit: Payer: Self-pay

## 2021-03-13 ENCOUNTER — Encounter: Payer: Self-pay | Admitting: Cardiology

## 2021-03-13 ENCOUNTER — Other Ambulatory Visit: Payer: Self-pay

## 2021-03-13 ENCOUNTER — Ambulatory Visit (INDEPENDENT_AMBULATORY_CARE_PROVIDER_SITE_OTHER): Payer: 59 | Admitting: Cardiology

## 2021-03-13 VITALS — BP 142/74 | HR 61 | Ht 71.0 in | Wt 154.0 lb

## 2021-03-13 DIAGNOSIS — I10 Essential (primary) hypertension: Secondary | ICD-10-CM | POA: Diagnosis not present

## 2021-03-13 DIAGNOSIS — E785 Hyperlipidemia, unspecified: Secondary | ICD-10-CM

## 2021-03-13 DIAGNOSIS — R931 Abnormal findings on diagnostic imaging of heart and coronary circulation: Secondary | ICD-10-CM

## 2021-03-13 DIAGNOSIS — F17200 Nicotine dependence, unspecified, uncomplicated: Secondary | ICD-10-CM | POA: Diagnosis not present

## 2021-03-13 NOTE — Progress Notes (Signed)
Cardiology Office Note:    Date:  03/13/2021   ID:  Justin Sale., DOB March 22, 1956, MRN [id number]  PCP:  Georgina Quint, MD  Cardiologist:  Garwin Brothers, MD   Referring MD: Georgina Quint, *    ASSESSMENT:    1. Elevated coronary artery calcium score   2. Essential hypertension   3. Dyslipidemia   4. Current smoker    PLAN:    In order of problems listed above:  Elevated calcium score: Secondary prevention stressed with the patient.  Importance of compliance with diet medication stressed and he vocalized understanding.  I told him to walk at least half an hour 5 days a week and he promises to do so. Essential hypertension: Blood pressure stable and diet was emphasized.  Lifestyle modification urged. Mixed dyslipidemia: Lipids were reviewed and he will be having blood work today as is fasting.  Diet was emphasized. Cigarette smoker: I spent 5 minutes with the patient discussing solely about smoking. Smoking cessation was counseled. I suggested to the patient also different medications and pharmacological interventions. Patient is keen to try stopping on its own at this time. He will get back to me if he needs any further assistance in this matter. Patient will be seen in follow-up appointment in 6 months or earlier if the patient has any concerns    Medication Adjustments/Labs and Tests Ordered: Current medicines are reviewed at length with the patient today.  Concerns regarding medicines are outlined above.  Orders Placed This Encounter  Procedures   Basic metabolic panel   CBC with Differential/Platelet   Hepatic function panel   Lipid panel   TSH   EKG 12-Lead   No orders of the defined types were placed in this encounter.    No chief complaint on file.    History of Present Illness:    Justin Wyles. is a 65 y.o. male.  Patient has past medical history of elevated calcium score, essential hypertension dyslipidemia and cigarette smoking.   He denies any problems at this time and takes care of activities of daily living.  No chest pain orthopnea or PND.  At the time of my evaluation, the patient is alert awake oriented and in no distress.  He tells me its been about 8 days since his quit smoking.  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/04/2020   Essential hypertension 02/02/2019   H/O TIA (transient ischemic attack) and stroke 11/29/2014   Hyperlipidemia    Premature heartbeats 09/28/2019   Recent urinary tract infection 02/22/2021    Past Surgical History:  Procedure Laterality Date   NO PAST SURGERIES      Current Medications: Current Meds  Medication Sig   aspirin EC 81 MG tablet Take 81 mg by mouth daily.   atorvastatin (LIPITOR) 40 MG tablet Take 1 tablet (40 mg total) by mouth daily.   cholecalciferol (VITAMIN D3) 25 MCG (1000 UNIT) tablet Take 1,000 Units by mouth daily.   metoprolol succinate (TOPROL-XL) 50 MG 24 hr tablet Take 50 mg by mouth daily.     Allergies:   Patient has no known allergies.   Social History   Socioeconomic History   Marital  status: Divorced    Spouse name: Not on file   Number of children: Not on file   Years of education: Not on file   Highest education level: Not on file  Occupational History   Not on file  Tobacco Use   Smoking status: Some Days    Packs/day: 0.25    Pack years: 0.00    Types: Cigarettes    Last attempt to quit: 09/24/2019    Years since quitting: 1.4   Smokeless tobacco: Never   Tobacco comments:    3 cigarettes a day  Vaping Use   Vaping Use: Never used  Substance and Sexual Activity   Alcohol use: Yes    Alcohol/week: 0.0 standard  drinks    Comment: occ   Drug use: Yes    Types: Marijuana   Sexual activity: Not on file  Other Topics Concern   Not on file  Social History Narrative   Not on file   Social Determinants of Health   Financial Resource Strain: Not on file  Food Insecurity: Not on file  Transportation Needs: Not on file  Physical Activity: Not on file  Stress: Not on file  Social Connections: Not on file     Family History: The patient's family history includes Cancer in his mother and sister; Pancreatic cancer in his mother. There is no history of Colon cancer.  ROS:   Please see the history of present illness.    All other systems reviewed and are negative.  EKGs/Labs/Other Studies Reviewed:    The following studies were reviewed today: EKG reveals sinus rhythm and nonspecific ST-T changes   Recent Labs: 07/04/2020: ALT 13; BUN 10; Creatinine, Ser 1.07; Hemoglobin 12.4; Platelets 231; Potassium 5.0; Sodium 138; TSH 1.760  Recent Lipid Panel    Component Value Date/Time   CHOL 135 07/04/2020 0858   TRIG 53 07/04/2020 0858   HDL 43 07/04/2020 0858   CHOLHDL 3.1 07/04/2020 0858   CHOLHDL 3.0 10/24/2015 1219   VLDL 8 10/24/2015 1219   LDLCALC 80 07/04/2020 0858    Physical Exam:    VS:  BP (!) 142/74   Pulse 61   Ht 5\' 11"  (1.803 m)   Wt 154 lb (69.9 kg)   SpO2 99%   BMI 21.48 kg/m     Wt Readings from Last 3 Encounters:  03/13/21 154 lb (69.9 kg)  02/22/21 152 lb (68.9 kg)  02/10/21 157 lb (71.2 kg)     GEN: Patient is in no acute distress HEENT: Normal NECK: No JVD; No carotid bruits LYMPHATICS: No lymphadenopathy CARDIAC: Hear sounds regular, 2/6 systolic murmur at the apex. RESPIRATORY:  Clear to auscultation without rales, wheezing or rhonchi  ABDOMEN: Soft, non-tender, non-distended MUSCULOSKELETAL:  No edema; No deformity  SKIN: Warm and dry NEUROLOGIC:  Alert and oriented x 3 PSYCHIATRIC:  Normal affect   Signed, 02/12/21, MD  03/13/2021 8:58 AM     [facility]

## 2021-03-13 NOTE — Patient Instructions (Signed)

## 2021-03-15 ENCOUNTER — Telehealth: Payer: Self-pay | Admitting: Cardiology

## 2021-03-15 LAB — CBC WITH DIFFERENTIAL/PLATELET
Basophils Absolute: 0.1 10*3/uL (ref 0.0–0.2)
Basos: 1 %
EOS (ABSOLUTE): 0.1 10*3/uL (ref 0.0–0.4)
Eos: 1 %
Hematocrit: 34.6 % — ABNORMAL LOW (ref 37.5–51.0)
Hemoglobin: 11.5 g/dL — ABNORMAL LOW (ref 13.0–17.7)
Immature Grans (Abs): 0 10*3/uL (ref 0.0–0.1)
Immature Granulocytes: 0 %
Lymphocytes Absolute: 2.4 10*3/uL (ref 0.7–3.1)
Lymphs: 27 %
MCH: 30.6 pg (ref 26.6–33.0)
MCHC: 33.2 g/dL (ref 31.5–35.7)
MCV: 92 fL (ref 79–97)
Monocytes Absolute: 0.8 10*3/uL (ref 0.1–0.9)
Monocytes: 9 %
Neutrophils Absolute: 5.6 10*3/uL (ref 1.4–7.0)
Neutrophils: 62 %
Platelets: 191 10*3/uL (ref 150–450)
RBC: 3.76 x10E6/uL — ABNORMAL LOW (ref 4.14–5.80)
RDW: 12.3 % (ref 11.6–15.4)
WBC: 8.9 10*3/uL (ref 3.4–10.8)

## 2021-03-15 LAB — BASIC METABOLIC PANEL
BUN/Creatinine Ratio: 12 (ref 10–24)
BUN: 11 mg/dL (ref 8–27)
CO2: 24 mmol/L (ref 20–29)
Calcium: 9.1 mg/dL (ref 8.6–10.2)
Chloride: 101 mmol/L (ref 96–106)
Creatinine, Ser: 0.9 mg/dL (ref 0.76–1.27)
Glucose: 99 mg/dL (ref 65–99)
Potassium: 4.2 mmol/L (ref 3.5–5.2)
Sodium: 139 mmol/L (ref 134–144)
eGFR: 96 mL/min/{1.73_m2} (ref >59)

## 2021-03-15 LAB — HEPATIC FUNCTION PANEL
ALT: 11 IU/L (ref 0–44)
AST: 15 IU/L (ref 0–40)
Albumin: 3.9 g/dL (ref 3.8–4.8)
Alkaline Phosphatase: 85 IU/L (ref 44–121)
Bilirubin Total: <0.2 mg/dL (ref 0.0–1.2)
Bilirubin, Direct: <0.1 mg/dL (ref 0.00–0.40)
Total Protein: 6.8 g/dL (ref 6.0–8.5)

## 2021-03-15 LAB — LIPID PANEL
Chol/HDL Ratio: 3.3 ratio (ref 0.0–5.0)
Cholesterol, Total: 140 mg/dL (ref 100–199)
HDL: 43 mg/dL (ref >39)
LDL Chol Calc (NIH): 85 mg/dL (ref 0–99)
Triglycerides: 57 mg/dL (ref 0–149)
VLDL Cholesterol Cal: 12 mg/dL (ref 5–40)

## 2021-03-15 LAB — TSH: TSH: 1.04 u[IU]/mL (ref 0.450–4.500)

## 2021-03-15 NOTE — Telephone Encounter (Signed)
Follow up:      Patient would like to speak back with the nurse concering results.

## 2021-03-15 NOTE — Telephone Encounter (Signed)
Left VM to call back 

## 2021-03-15 NOTE — Telephone Encounter (Signed)
Results reviewed with pt as per Dr. Revankar's note.  Pt verbalized understanding and had no additional questions. Routed to PCP.  

## 2021-08-07 ENCOUNTER — Other Ambulatory Visit: Payer: Self-pay

## 2021-08-07 ENCOUNTER — Encounter: Payer: Self-pay | Admitting: Cardiology

## 2021-08-07 ENCOUNTER — Ambulatory Visit (INDEPENDENT_AMBULATORY_CARE_PROVIDER_SITE_OTHER): Payer: 59 | Admitting: Cardiology

## 2021-08-07 VITALS — BP 114/60 | HR 70 | Ht 71.0 in | Wt 150.0 lb

## 2021-08-07 DIAGNOSIS — E782 Mixed hyperlipidemia: Secondary | ICD-10-CM | POA: Diagnosis not present

## 2021-08-07 DIAGNOSIS — F17200 Nicotine dependence, unspecified, uncomplicated: Secondary | ICD-10-CM | POA: Diagnosis not present

## 2021-08-07 DIAGNOSIS — I10 Essential (primary) hypertension: Secondary | ICD-10-CM | POA: Diagnosis not present

## 2021-08-07 DIAGNOSIS — R931 Abnormal findings on diagnostic imaging of heart and coronary circulation: Secondary | ICD-10-CM | POA: Diagnosis not present

## 2021-08-07 NOTE — Patient Instructions (Signed)
Medication Instructions:  Your physician recommends that you continue on your current medications as directed. Please refer to the Current Medication list given to you today.  *If you need a refill on your cardiac medications before your next appointment, please call your pharmacy*   Lab Work: Your physician recommends that you have labs done in the office today. Your test included  basic metabolic panel, complete blood count, TSH, liver function and lipids.  If you have labs (blood work) drawn today and your tests are completely normal, you will receive your results only by: MyChart Message (if you have MyChart) OR A paper copy in the mail If you have any lab test that is abnormal or we need to change your treatment, we will call you to review the results.   Testing/Procedures: None ordered   Follow-Up: At [facility], you and your health needs are our priority.  As part of our continuing mission to provide you with exceptional heart care, we have created designated Provider Care Teams.  These Care Teams include your primary Cardiologist (physician) and Advanced Practice Providers (APPs -  Physician Assistants and Nurse Practitioners) who all work together to provide you with the care you need, when you need it.  We recommend signing up for the patient portal called "MyChart".  Sign up information is provided on this After Visit Summary.  MyChart is used to connect with patients for Virtual Visits (Telemedicine).  Patients are able to view lab/test results, encounter notes, upcoming appointments, etc.  Non-urgent messages can be sent to your provider as well.   To learn more about what you can do with MyChart, go to [email or web address].    Your next appointment:   9 month(s)  The format for your next appointment:   In Person  Provider:   Rajan Revankar, MD   Other Instructions NA   

## 2021-08-07 NOTE — Progress Notes (Signed)
Cardiology Office Note:    Date:  08/07/2021   ID:  Justin Odom., DOB [birth year], MRN [id number]  PCP:  Georgina Quint, MD  Cardiologist:  Garwin Brothers, MD   Referring MD: Georgina Quint, *    ASSESSMENT:    1. Elevated coronary artery calcium score   2. Essential hypertension   3. Mixed hyperlipidemia   4. Current smoker    PLAN:    In order of problems listed above:  Elevated calcium score: Secondary prevention stressed with the patient.  Importance of compliance with diet medication stressed any vocalized understanding.  Advised him to walk at least half an hour a day 5 days a week and he promises to do so. Essential hypertension: Blood pressure stable and lifestyle modification was urged. Mixed dyslipidemia: He is fasting and will have blood work.  Diet emphasized. Cigarette smoker: I spent 5 minutes with the patient discussing solely about smoking. Smoking cessation was counseled. I suggested to the patient also different medications and pharmacological interventions. Patient is keen to try stopping on its own at this time. He will get back to me if he needs any further assistance in this matter. Patient will be seen in follow-up appointment in 9 months or earlier if the patient has any concerns    Medication Adjustments/Labs and Tests Ordered: Current medicines are reviewed at length with the patient today.  Concerns regarding medicines are outlined above.  Orders Placed This Encounter  Procedures   Basic metabolic panel   CBC with Differential/Platelet   Hepatic function panel   Lipid panel   TSH   No orders of the defined types were placed in this encounter.    No chief complaint on file.    History of Present Illness:    Justin Odom. is a 65 y.o. male.  Patient has past medical history of elevated calcium score, essential hypertension, mixed dyslipidemia and cigarette smoking.  He denies any problems at this time and takes care of  activities of daily living.  No chest pain orthopnea or PND.  Unfortunately he continues to smoke.  He leads a sedentary lifestyle.  He works full-time.  At the time of my evaluation, the patient is alert awake oriented and in no distress.  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/04/2020   Essential hypertension 02/02/2019   H/O TIA (transient ischemic attack) and stroke 11/29/2014   Hyperlipidemia    Premature heartbeats 09/28/2019   Recent urinary tract infection 02/22/2021    Past Surgical History:  Procedure Laterality Date   NO PAST SURGERIES      Current Medications: Current Meds  Medication Sig   aspirin EC 81 MG tablet Take 81 mg by mouth daily.   atorvastatin (LIPITOR) 40 MG tablet Take 1 tablet (40 mg total) by mouth daily.   cholecalciferol (VITAMIN D3) 25 MCG (1000 UNIT) tablet Take 1,000 Units by mouth daily.   metoprolol succinate (TOPROL-XL) 50 MG 24 hr tablet Take 50 mg by mouth daily.     Allergies:   Patient has no known allergies.   Social History   Socioeconomic History   Marital status: Divorced    Spouse name: Not on  file   Number of children: Not on file   Years of education: Not on file   Highest education level: Not on file  Occupational History   Not on file  Tobacco Use   Smoking status: Some Days    Packs/day: 0.25    Types: Cigarettes    Last attempt to quit: 09/24/2019    Years since quitting: 1.8   Smokeless tobacco: Never   Tobacco comments:    3 cigarettes a day  Vaping Use   Vaping Use: Never used  Substance and Sexual Activity   Alcohol use: Yes    Alcohol/week: 0.0 standard drinks    Comment: occ   Drug use: Yes     Types: Marijuana   Sexual activity: Not on file  Other Topics Concern   Not on file  Social History Narrative   Not on file   Social Determinants of Health   Financial Resource Strain: Not on file  Food Insecurity: Not on file  Transportation Needs: Not on file  Physical Activity: Not on file  Stress: Not on file  Social Connections: Not on file     Family History: The patient's family history includes Cancer in his mother and sister; Pancreatic cancer in his mother. There is no history of Colon cancer.  ROS:   Please see the history of present illness.    All other systems reviewed and are negative.  EKGs/Labs/Other Studies Reviewed:    The following studies were reviewed today: I discussed my findings with the patient at length.   Recent Labs: 03/13/2021: ALT 11; BUN 11; Creatinine, Ser 0.90; Hemoglobin 11.5; Platelets 191; Potassium 4.2; Sodium 139; TSH 1.040  Recent Lipid Panel    Component Value Date/Time   CHOL 140 03/13/2021 0859   TRIG 57 03/13/2021 0859   HDL 43 03/13/2021 0859   CHOLHDL 3.3 03/13/2021 0859   CHOLHDL 3.0 10/24/2015 1219   VLDL 8 10/24/2015 1219   LDLCALC 85 03/13/2021 0859    Physical Exam:    VS:  BP 114/60   Pulse 70   Ht 5\' 11"  (1.803 m)   Wt 150 lb (68 kg)   SpO2 99%   BMI 20.92 kg/m     Wt Readings from Last 3 Encounters:  08/07/21 150 lb (68 kg)  03/13/21 154 lb (69.9 kg)  02/22/21 152 lb (68.9 kg)     GEN: Patient is in no acute distress HEENT: Normal NECK: No JVD; No carotid bruits LYMPHATICS: No lymphadenopathy CARDIAC: Hear sounds regular, 2/6 systolic murmur at the apex. RESPIRATORY:  Clear to auscultation without rales, wheezing or rhonchi  ABDOMEN: Soft, non-tender, non-distended MUSCULOSKELETAL:  No edema; No deformity  SKIN: Warm and dry NEUROLOGIC:  Alert and oriented x 3 PSYCHIATRIC:  Normal affect   Signed, 04/24/21, MD  08/07/2021 8:59 AM    [facility]

## 2021-08-08 LAB — CBC WITH DIFFERENTIAL/PLATELET
Basophils Absolute: 0 x10E3/uL (ref 0.0–0.2)
Basos: 0 %
EOS (ABSOLUTE): 0.1 x10E3/uL (ref 0.0–0.4)
Eos: 1 %
Hematocrit: 38.4 % (ref 37.5–51.0)
Hemoglobin: 12.6 g/dL — ABNORMAL LOW (ref 13.0–17.7)
Immature Grans (Abs): 0 x10E3/uL (ref 0.0–0.1)
Immature Granulocytes: 0 %
Lymphocytes Absolute: 2.2 x10E3/uL (ref 0.7–3.1)
Lymphs: 30 %
MCH: 30.4 pg (ref 26.6–33.0)
MCHC: 32.8 g/dL (ref 31.5–35.7)
MCV: 93 fL (ref 79–97)
Monocytes Absolute: 0.7 x10E3/uL (ref 0.1–0.9)
Monocytes: 9 %
Neutrophils Absolute: 4.4 x10E3/uL (ref 1.4–7.0)
Neutrophils: 60 %
Platelets: 238 x10E3/uL (ref 150–450)
RBC: 4.15 x10E6/uL (ref 4.14–5.80)
RDW: 12.5 % (ref 11.6–15.4)
WBC: 7.4 x10E3/uL (ref 3.4–10.8)

## 2021-08-08 LAB — BASIC METABOLIC PANEL
BUN/Creatinine Ratio: 11 (ref 10–24)
BUN: 10 mg/dL (ref 8–27)
CO2: 27 mmol/L (ref 20–29)
Calcium: 9.3 mg/dL (ref 8.6–10.2)
Chloride: 102 mmol/L (ref 96–106)
Creatinine, Ser: 0.9 mg/dL (ref 0.76–1.27)
Glucose: 95 mg/dL (ref 70–99)
Potassium: 4.5 mmol/L (ref 3.5–5.2)
Sodium: 139 mmol/L (ref 134–144)
eGFR: 95 mL/min/{1.73_m2} (ref >59)

## 2021-08-08 LAB — LIPID PANEL
Chol/HDL Ratio: 3.3 ratio (ref 0.0–5.0)
Cholesterol, Total: 126 mg/dL (ref 100–199)
HDL: 38 mg/dL — ABNORMAL LOW (ref >39)
LDL Chol Calc (NIH): 77 mg/dL (ref 0–99)
Triglycerides: 49 mg/dL (ref 0–149)
VLDL Cholesterol Cal: 11 mg/dL (ref 5–40)

## 2021-08-08 LAB — HEPATIC FUNCTION PANEL
ALT: 13 IU/L (ref 0–44)
AST: 21 IU/L (ref 0–40)
Albumin: 4.2 g/dL (ref 3.8–4.8)
Alkaline Phosphatase: 76 IU/L (ref 44–121)
Bilirubin Total: 0.4 mg/dL (ref 0.0–1.2)
Bilirubin, Direct: 0.12 mg/dL (ref 0.00–0.40)
Total Protein: 7.1 g/dL (ref 6.0–8.5)

## 2021-08-08 LAB — TSH: TSH: 0.83 u[IU]/mL (ref 0.450–4.500)

## 2021-08-28 ENCOUNTER — Other Ambulatory Visit: Payer: Self-pay

## 2021-08-28 ENCOUNTER — Encounter: Payer: Self-pay | Admitting: Emergency Medicine

## 2021-08-28 ENCOUNTER — Ambulatory Visit (INDEPENDENT_AMBULATORY_CARE_PROVIDER_SITE_OTHER): Payer: 59 | Admitting: Emergency Medicine

## 2021-08-28 VITALS — BP 118/68 | HR 87 | Ht 71.0 in | Wt 152.0 lb

## 2021-08-28 DIAGNOSIS — F17200 Nicotine dependence, unspecified, uncomplicated: Secondary | ICD-10-CM | POA: Diagnosis not present

## 2021-08-28 DIAGNOSIS — E785 Hyperlipidemia, unspecified: Secondary | ICD-10-CM

## 2021-08-28 DIAGNOSIS — I10 Essential (primary) hypertension: Secondary | ICD-10-CM

## 2021-08-28 NOTE — Assessment & Plan Note (Signed)
No smoking for 2 weeks.  Doing well.

## 2021-08-28 NOTE — Progress Notes (Signed)
Justin Odom. 65 y.o.   Chief Complaint  Patient presents with   Hypertension    6 month f/u    HISTORY OF PRESENT ILLNESS: This is a 65 y.o. male here for 1-month follow-up of hypertension. Recent blood work results done last month reviewed with patient. Unremarkable labs with acceptable values.  Normal lipid profile. No smoking for the last 2 weeks. Was seen by cardiologist last month with the following assessment and plan:  ASSESSMENT:     1. Elevated coronary artery calcium score   2. Essential hypertension   3. Mixed hyperlipidemia   4. Current smoker     PLAN:     In order of problems listed above:   Elevated calcium score: Secondary prevention stressed with the patient.  Importance of compliance with diet medication stressed any vocalized understanding.  Advised him to walk at least half an hour a day 5 days a week and he promises to do so. Essential hypertension: Blood pressure stable and lifestyle modification was urged. Mixed dyslipidemia: He is fasting and will have blood work.  Diet emphasized. Cigarette smoker: I spent 5 minutes with the patient discussing solely about smoking. Smoking cessation was counseled. I suggested to the patient also different medications and pharmacological interventions. Patient is keen to try stopping on its own at this time. He will get back to me if he needs any further assistance in this matter. Patient will be seen in follow-up appointment in 9 months or earlier if the patient has any concerns Doing well.  Has no complaints or medical concerns today.    Hypertension Pertinent negatives include no chest pain, headaches, palpitations or shortness of breath.    Prior to Admission medications   Medication Sig Start Date End Date Taking? Authorizing Provider  aspirin EC 81 MG tablet Take 81 mg by mouth daily.   Yes [provider]  atorvastatin (LIPITOR) 40 MG tablet Take 1 tablet (40 mg total) by mouth daily. 02/22/21   Yes Roena Sassaman, Eilleen Kempf, MD  cholecalciferol (VITAMIN D3) 25 MCG (1000 UNIT) tablet Take 1,000 Units by mouth daily.   Yes [provider]  metoprolol succinate (TOPROL-XL) 50 MG 24 hr tablet Take 50 mg by mouth daily. 09/16/19  Yes [provider]    No Known Allergies  Patient Active Problem List   Diagnosis Date Noted   Recent urinary tract infection 02/22/2021   Elevated coronary artery calcium score 07/04/2020   Hyperlipidemia 10/05/2019   Premature heartbeats 09/28/2019   Essential hypertension 02/02/2019   Current smoker 02/18/2017   DJD (degenerative joint disease) of cervical spine 07/04/2016   Dependence on nicotine from other tobacco product 10/24/2015   Dyslipidemia 05/02/2015   H/O TIA (transient ischemic attack) and stroke 11/29/2014    Past Medical History:  Diagnosis Date   Current smoker 02/18/2017   Dependence on nicotine from other tobacco product 10/24/2015   DJD (degenerative joint disease) of cervical spine 07/04/2016   Radiographs 05/28/2016: Mild to moderate disc space loss with mild endplate spurring at C5-C6.   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/04/2020   Essential hypertension 02/02/2019   H/O TIA (transient ischemic attack) and stroke 11/29/2014   Hyperlipidemia    Premature heartbeats 09/28/2019   Recent urinary tract infection 02/22/2021    Past Surgical History:  Procedure Laterality Date   NO PAST SURGERIES      Social History   Socioeconomic History   Marital status: Divorced    Spouse  name: Not on file   Number of children: Not on file   Years of education: Not on file   Highest education level: Not on file  Occupational History   Not on file  Tobacco Use   Smoking status: Some Days    Packs/day: 0.25    Types: Cigarettes    Last attempt to quit: 09/24/2019    Years since quitting: 1.9   Smokeless tobacco: Never   Tobacco comments:    3 cigarettes a day  Vaping Use   Vaping  Use: Never used  Substance and Sexual Activity   Alcohol use: Yes    Alcohol/week: 0.0 standard drinks    Comment: occ   Drug use: Yes    Types: Marijuana   Sexual activity: Not on file  Other Topics Concern   Not on file  Social History Narrative   Not on file   Social Determinants of Health   Financial Resource Strain: Not on file  Food Insecurity: Not on file  Transportation Needs: Not on file  Physical Activity: Not on file  Stress: Not on file  Social Connections: Not on file  Intimate Partner Violence: Not on file    Family History  Problem Relation Age of Onset   Cancer Mother    Pancreatic cancer Mother    Cancer Sister    Colon cancer Neg Hx      Review of Systems  Constitutional: Negative.  Negative for chills and fever.  HENT: Negative.  Negative for congestion and sore throat.   Respiratory: Negative.  Negative for cough and shortness of breath.   Cardiovascular: Negative.  Negative for chest pain and palpitations.  Gastrointestinal: Negative.  Negative for abdominal pain, diarrhea, nausea and vomiting.  Genitourinary: Negative.  Negative for dysuria and hematuria.  Musculoskeletal: Negative.   Skin: Negative.  Negative for rash.  Neurological: Negative.  Negative for dizziness and headaches.  All other systems reviewed and are negative.  Today's Vitals   08/28/21 1307  BP: 118/68  Pulse: 87  SpO2: 98%  Weight: 152 lb (68.9 kg)  Height: 5\' 11"  (1.803 m)   Body mass index is 21.2 kg/m.  Physical Exam Vitals reviewed.  Constitutional:      Appearance: Normal appearance.  HENT:     Head: Normocephalic.  Eyes:     Extraocular Movements: Extraocular movements intact.     Conjunctiva/sclera: Conjunctivae normal.     Pupils: Pupils are equal, round, and reactive to light.  Cardiovascular:     Rate and Rhythm: Normal rate and regular rhythm.     Pulses: Normal pulses.     Heart sounds: Normal heart sounds.  Pulmonary:     Effort: Pulmonary  effort is normal.     Breath sounds: Normal breath sounds.  Abdominal:     General: There is no distension.     Palpations: Abdomen is soft.     Tenderness: There is no abdominal tenderness.  Musculoskeletal:        General: Normal range of motion.     Cervical back: Normal range of motion and neck supple.  Skin:    General: Skin is warm and dry.     Capillary Refill: Capillary refill takes less than 2 seconds.  Neurological:     General: No focal deficit present.     Mental Status: He is alert and oriented to person, place, and time.  Psychiatric:        Mood and Affect: Mood normal.  Behavior: Behavior normal.     ASSESSMENT & PLAN: Problem List Items Addressed This Visit       Cardiovascular and Mediastinum   Essential hypertension - Primary    Well-controlled hypertension.  Continue metoprolol succinate 50 mg daily. Dietary approaches to stop hypertension discussed.        Other   Dyslipidemia    Diet and nutrition discussed.  Continue atorvastatin 40 mg daily. Normal recent lipid profile.      Current smoker    No smoking for 2 weeks.  Doing well.      Patient Instructions  Hypertension, Adult High blood pressure (hypertension) is when the force of blood pumping through the arteries is too strong. The arteries are the blood vessels that carry blood from the heart throughout the body. Hypertension forces the heart to work harder to pump blood and may cause arteries to become narrow or stiff. Untreated or uncontrolled hypertension can cause a heart attack, heart failure, a stroke, kidney disease, and other problems. A blood pressure reading consists of a higher number over a lower number. Ideally, your blood pressure should be below 120/80. The first ("top") number is called the systolic pressure. It is a measure of the pressure in your arteries as your heart beats. The second ("bottom") number is called the diastolic pressure. It is a measure of the pressure in  your arteries as the heart relaxes. What are the causes? The exact cause of this condition is not known. There are some conditions that result in or are related to high blood pressure. What increases the risk? Some risk factors for high blood pressure are under your control. The following factors may make you more likely to develop this condition: Smoking. Having type 2 diabetes mellitus, high cholesterol, or both. Not getting enough exercise or physical activity. Being overweight. Having too much fat, sugar, calories, or salt (sodium) in your diet. Drinking too much alcohol. Some risk factors for high blood pressure may be difficult or impossible to change. Some of these factors include: Having chronic kidney disease. Having a family history of high blood pressure. Age. Risk increases with age. Race. You may be at higher risk if you are African American. Gender. Men are at higher risk than women before age 11. After age 71, women are at higher risk than men. Having obstructive sleep apnea. Stress. What are the signs or symptoms? High blood pressure may not cause symptoms. Very high blood pressure (hypertensive crisis) may cause: Headache. Anxiety. Shortness of breath. Nosebleed. Nausea and vomiting. Vision changes. Severe chest pain. Seizures. How is this diagnosed? This condition is diagnosed by measuring your blood pressure while you are seated, with your arm resting on a flat surface, your legs uncrossed, and your feet flat on the floor. The cuff of the blood pressure monitor will be placed directly against the skin of your upper arm at the level of your heart. It should be measured at least twice using the same arm. Certain conditions can cause a difference in blood pressure between your right and left arms. Certain factors can cause blood pressure readings to be lower or higher than normal for a short period of time: When your blood pressure is higher when you are in a health  care provider's office than when you are at home, this is called white coat hypertension. Most people with this condition do not need medicines. When your blood pressure is higher at home than when you are in a health care provider's  office, this is called masked hypertension. Most people with this condition may need medicines to control blood pressure. If you have a high blood pressure reading during one visit or you have normal blood pressure with other risk factors, you may be asked to: Return on a different day to have your blood pressure checked again. Monitor your blood pressure at home for 1 week or longer. If you are diagnosed with hypertension, you may have other blood or imaging tests to help your health care provider understand your overall risk for other conditions. How is this treated? This condition is treated by making healthy lifestyle changes, such as eating healthy foods, exercising more, and reducing your alcohol intake. Your health care provider may prescribe medicine if lifestyle changes are not enough to get your blood pressure under control, and if: Your systolic blood pressure is above 130. Your diastolic blood pressure is above 80. Your personal target blood pressure may vary depending on your medical conditions, your age, and other factors. Follow these instructions at home: Eating and drinking  Eat a diet that is high in fiber and potassium, and low in sodium, added sugar, and fat. An example eating plan is called the DASH (Dietary Approaches to Stop Hypertension) diet. To eat this way: Eat plenty of fresh fruits and vegetables. Try to fill one half of your plate at each meal with fruits and vegetables. Eat whole grains, such as whole-wheat pasta, brown rice, or whole-grain bread. Fill about one fourth of your plate with whole grains. Eat or drink low-fat dairy products, such as skim milk or low-fat yogurt. Avoid fatty cuts of meat, processed or cured meats, and poultry  with skin. Fill about one fourth of your plate with lean proteins, such as fish, chicken without skin, beans, eggs, or tofu. Avoid pre-made and processed foods. These tend to be higher in sodium, added sugar, and fat. Reduce your daily sodium intake. Most people with hypertension should eat less than 1,500 mg of sodium a day. Do not drink alcohol if: Your health care provider tells you not to drink. You are pregnant, may be pregnant, or are planning to become pregnant. If you drink alcohol: Limit how much you use to: 0-1 drink a day for women. 0-2 drinks a day for men. Be aware of how much alcohol is in your drink. In the [place] one drink equals one 12 oz bottle of beer (355 mL), one 5 oz glass of wine (148 mL), or one 1 oz glass of hard liquor (44 mL). Lifestyle  Work with your health care provider to maintain a healthy body weight or to lose weight. Ask what an ideal weight is for you. Get at least 30 minutes of exercise most days of the week. Activities may include walking, swimming, or biking. Include exercise to strengthen your muscles (resistance exercise), such as Pilates or lifting weights, as part of your weekly exercise routine. Try to do these types of exercises for 30 minutes at least 3 days a week. Do not use any products that contain nicotine or tobacco, such as cigarettes, e-cigarettes, and chewing tobacco. If you need help quitting, ask your health care provider. Monitor your blood pressure at home as told by your health care provider. Keep all follow-up visits as told by your health care provider. This is important. Medicines Take over-the-counter and prescription medicines only as told by your health care provider. Follow directions carefully. Blood pressure medicines must be taken as prescribed. Do not skip doses of blood  pressure medicine. Doing this puts you at risk for problems and can make the medicine less effective. Ask your health care provider about side effects or  reactions to medicines that you should watch for. Contact a health care provider if you: Think you are having a reaction to a medicine you are taking. Have headaches that keep coming back (recurring). Feel dizzy. Have swelling in your ankles. Have trouble with your vision. Get help right away if you: Develop a severe headache or confusion. Have unusual weakness or numbness. Feel faint. Have severe pain in your chest or abdomen. Vomit repeatedly. Have trouble breathing. Summary Hypertension is when the force of blood pumping through your arteries is too strong. If this condition is not controlled, it may put you at risk for serious complications. Your personal target blood pressure may vary depending on your medical conditions, your age, and other factors. For most people, a normal blood pressure is less than 120/80. Hypertension is treated with lifestyle changes, medicines, or a combination of both. Lifestyle changes include losing weight, eating a healthy, low-sodium diet, exercising more, and limiting alcohol. This information is not intended to replace advice given to you by your health care provider. Make sure you discuss any questions you have with your health care provider. Document Revised: 05/14/2018 Document Reviewed: 05/14/2018 Elsevier Patient Education  2022 [place], MD [facility]

## 2021-08-28 NOTE — Assessment & Plan Note (Signed)
Well-controlled hypertension.  Continue metoprolol succinate 50 mg daily. Dietary approaches to stop hypertension discussed.

## 2021-08-28 NOTE — Patient Instructions (Signed)

## 2021-08-28 NOTE — Assessment & Plan Note (Signed)
Diet and nutrition discussed.  Continue atorvastatin 40 mg daily. Normal recent lipid profile.

## 2022-01-04 ENCOUNTER — Other Ambulatory Visit: Payer: Self-pay | Admitting: Cardiology

## 2022-02-26 ENCOUNTER — Encounter: Payer: Self-pay | Admitting: Emergency Medicine

## 2022-02-26 ENCOUNTER — Ambulatory Visit (INDEPENDENT_AMBULATORY_CARE_PROVIDER_SITE_OTHER): Payer: 59 | Admitting: Emergency Medicine

## 2022-02-26 VITALS — BP 130/80 | HR 61 | Temp 97.9°F | Ht 71.0 in | Wt 151.5 lb

## 2022-02-26 DIAGNOSIS — I10 Essential (primary) hypertension: Secondary | ICD-10-CM | POA: Diagnosis not present

## 2022-02-26 DIAGNOSIS — F17200 Nicotine dependence, unspecified, uncomplicated: Secondary | ICD-10-CM | POA: Diagnosis not present

## 2022-02-26 DIAGNOSIS — R69 Illness, unspecified: Secondary | ICD-10-CM | POA: Diagnosis not present

## 2022-02-26 DIAGNOSIS — E785 Hyperlipidemia, unspecified: Secondary | ICD-10-CM | POA: Diagnosis not present

## 2022-02-26 MED ORDER — ATORVASTATIN CALCIUM 40 MG PO TABS: 40.0000 mg | ORAL_TABLET | Freq: Every day | ORAL | 3 refills | Status: DC

## 2022-02-26 NOTE — Assessment & Plan Note (Signed)
Well-controlled hypertension. Continue metoprolol succinate 50 mg daily. Dietary approaches to stop hypertension discussed. Cardiovascular risks associated with hypertension discussed. Follow-up in 6 months. BP Readings from Last 3 Encounters:  02/26/22 130/80  08/28/21 118/68  08/07/21 114/60

## 2022-02-26 NOTE — Patient Instructions (Signed)
Hypertension, Adult High blood pressure (hypertension) is when the force of blood pumping through the arteries is too strong. The arteries are the blood vessels that carry blood from the heart throughout the body. Hypertension forces the heart to work harder to pump blood and may cause arteries to become narrow or stiff. Untreated or uncontrolled hypertension can lead to a heart attack, heart failure, a stroke, kidney disease, and other problems. A blood pressure reading consists of a higher number over a lower number. Ideally, your blood pressure should be below 120/80. The first ("top") number is called the systolic pressure. It is a measure of the pressure in your arteries as your heart beats. The second ("bottom") number is called the diastolic pressure. It is a measure of the pressure in your arteries as the heart relaxes. What are the causes? The exact cause of this condition is not known. There are some conditions that result in high blood pressure. What increases the risk? Certain factors may make you more likely to develop high blood pressure. Some of these risk factors are under your control, including: Smoking. Not getting enough exercise or physical activity. Being overweight. Having too much fat, sugar, calories, or salt (sodium) in your diet. Drinking too much alcohol. Other risk factors include: Having a personal history of heart disease, diabetes, high cholesterol, or kidney disease. Stress. Having a family history of high blood pressure and high cholesterol. Having obstructive sleep apnea. Age. The risk increases with age. What are the signs or symptoms? High blood pressure may not cause symptoms. Very high blood pressure (hypertensive crisis) may cause: Headache. Fast or irregular heartbeats (palpitations). Shortness of breath. Nosebleed. Nausea and vomiting. Vision changes. Severe chest pain, dizziness, and seizures. How is this diagnosed? This condition is diagnosed by  measuring your blood pressure while you are seated, with your arm resting on a flat surface, your legs uncrossed, and your feet flat on the floor. The cuff of the blood pressure monitor will be placed directly against the skin of your upper arm at the level of your heart. Blood pressure should be measured at least twice using the same arm. Certain conditions can cause a difference in blood pressure between your right and left arms. If you have a high blood pressure reading during one visit or you have normal blood pressure with other risk factors, you may be asked to: Return on a different day to have your blood pressure checked again. Monitor your blood pressure at home for 1 week or longer. If you are diagnosed with hypertension, you may have other blood or imaging tests to help your health care provider understand your overall risk for other conditions. How is this treated? This condition is treated by making healthy lifestyle changes, such as eating healthy foods, exercising more, and reducing your alcohol intake. You may be referred for counseling on a healthy diet and physical activity. Your health care provider may prescribe medicine if lifestyle changes are not enough to get your blood pressure under control and if: Your systolic blood pressure is above 130. Your diastolic blood pressure is above 80. Your personal target blood pressure may vary depending on your medical conditions, your age, and other factors. Follow these instructions at home: Eating and drinking  Eat a diet that is high in fiber and potassium, and low in sodium, added sugar, and fat. An example of this eating plan is called the DASH diet. DASH stands for Dietary Approaches to Stop Hypertension. To eat this way: Eat   plenty of fresh fruits and vegetables. Try to fill one half of your plate at each meal with fruits and vegetables. Eat whole grains, such as whole-wheat pasta, brown rice, or whole-grain bread. Fill about one  fourth of your plate with whole grains. Eat or drink low-fat dairy products, such as skim milk or low-fat yogurt. Avoid fatty cuts of meat, processed or cured meats, and poultry with skin. Fill about one fourth of your plate with lean proteins, such as fish, chicken without skin, beans, eggs, or tofu. Avoid pre-made and processed foods. These tend to be higher in sodium, added sugar, and fat. Reduce your daily sodium intake. Many people with hypertension should eat less than 1,500 mg of sodium a day. Do not drink alcohol if: Your health care provider tells you not to drink. You are pregnant, may be pregnant, or are planning to become pregnant. If you drink alcohol: Limit how much you have to: 0-1 drink a day for women. 0-2 drinks a day for men. Know how much alcohol is in your drink. In the [place] one drink equals one 12 oz bottle of beer (355 mL), one 5 oz glass of wine (148 mL), or one 1 oz glass of hard liquor (44 mL). Lifestyle  Work with your health care provider to maintain a healthy body weight or to lose weight. Ask what an ideal weight is for you. Get at least 30 minutes of exercise that causes your heart to beat faster (aerobic exercise) most days of the week. Activities may include walking, swimming, or biking. Include exercise to strengthen your muscles (resistance exercise), such as Pilates or lifting weights, as part of your weekly exercise routine. Try to do these types of exercises for 30 minutes at least 3 days a week. Do not use any products that contain nicotine or tobacco. These products include cigarettes, chewing tobacco, and vaping devices, such as e-cigarettes. If you need help quitting, ask your health care provider. Monitor your blood pressure at home as told by your health care provider. Keep all follow-up visits. This is important. Medicines Take over-the-counter and prescription medicines only as told by your health care provider. Follow directions carefully. Blood  pressure medicines must be taken as prescribed. Do not skip doses of blood pressure medicine. Doing this puts you at risk for problems and can make the medicine less effective. Ask your health care provider about side effects or reactions to medicines that you should watch for. Contact a health care provider if you: Think you are having a reaction to a medicine you are taking. Have headaches that keep coming back (recurring). Feel dizzy. Have swelling in your ankles. Have trouble with your vision. Get help right away if you: Develop a severe headache or confusion. Have unusual weakness or numbness. Feel faint. Have severe pain in your chest or abdomen. Vomit repeatedly. Have trouble breathing. These symptoms may be an emergency. Get help right away. Call 911. Do not wait to see if the symptoms will go away. Do not drive yourself to the hospital. Summary Hypertension is when the force of blood pumping through your arteries is too strong. If this condition is not controlled, it may put you at risk for serious complications. Your personal target blood pressure may vary depending on your medical conditions, your age, and other factors. For most people, a normal blood pressure is less than 120/80. Hypertension is treated with lifestyle changes, medicines, or a combination of both. Lifestyle changes include losing weight, eating a healthy,   low-sodium diet, exercising more, and limiting alcohol. This information is not intended to replace advice given to you by your health care provider. Make sure you discuss any questions you have with your health care provider. Document Revised: 07/11/2021 Document Reviewed: 07/11/2021 Elsevier Patient Education  2023 [employer]  

## 2022-02-26 NOTE — Progress Notes (Signed)
Justin Odom. 66 y.o.   Chief Complaint  Patient presents with   Follow-up    No concerns     HISTORY OF PRESENT ILLNESS: This is a 66 y.o. male A1A here for 89-month follow-up of hypertension, dyslipidemia.  Still smoking although less. Has no complaints or medical concerns today. Overall doing very well.  HPI   Prior to Admission medications   Medication Sig Start Date End Date Taking? Authorizing Provider  aspirin EC 81 MG tablet Take 81 mg by mouth daily.   Yes [provider]  atorvastatin (LIPITOR) 40 MG tablet Take 1 tablet (40 mg total) by mouth daily. 02/22/21  Yes Berline Semrad, Ines Bloomer, MD  cholecalciferol (VITAMIN D3) 25 MCG (1000 UNIT) tablet Take 1,000 Units by mouth daily.   Yes [provider]  metoprolol succinate (TOPROL-XL) 50 MG 24 hr tablet Take 1 tablet (50 mg total) by mouth daily. 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/04/2020   Essential hypertension 02/02/2019   H/O TIA (transient ischemic attack) and stroke 11/29/2014   Hyperlipidemia    Premature heartbeats 09/28/2019   Recent urinary tract infection 02/22/2021     Past Surgical History:  Procedure Laterality Date   NO PAST SURGERIES      Social History   Socioeconomic History   Marital status: Divorced    Spouse name: Not on file   Number of children: Not on file   Years of education: Not on file   Highest education level: Not on file  Occupational History   Not on file  Tobacco Use   Smoking status: Some Days    Packs/day: 0.25    Types: Cigarettes    Last attempt to quit: 09/24/2019    Years since quitting: 2.4   Smokeless tobacco: Never   Tobacco comments:    3 cigarettes a day  Vaping Use   Vaping Use: Never used  Substance and Sexual Activity   Alcohol use: Yes    Alcohol/week: 0.0 standard drinks of alcohol    Comment: occ   Drug use: Yes    Types: Marijuana   Sexual activity: Not on file  Other Topics Concern   Not on file  Social History Narrative   Not on file   Social Determinants of Health   Financial Resource Strain: Not on file  Food Insecurity: Not on file  Transportation Needs: Not on file  Physical Activity: Not on file  Stress: Not on file  Social Connections: Not on file  Intimate Partner Violence: Not on file    Family History  Problem Relation Age of Onset   Cancer Mother    Pancreatic cancer Mother    Cancer Sister  Colon cancer Neg Hx      Review of Systems  Constitutional: Negative.  Negative for chills and fever.  HENT: Negative.  Negative for congestion and sore throat.   Respiratory: Negative.  Negative for cough and shortness of breath.   Cardiovascular:  Negative for chest pain and palpitations.  Gastrointestinal:  Negative for abdominal pain, diarrhea, nausea and vomiting.  Genitourinary: Negative.   Skin: Negative.  Negative for rash.  Neurological:  Negative for dizziness and headaches.  All other systems reviewed and are negative.  Today's Vitals   02/26/22 0801  BP: 130/80  Pulse: 61  Temp: 97.9 F (36.6 C)  TempSrc: Oral  SpO2: 97%  Weight: 151 lb 8 oz (68.7  kg)  Height: 5\' 11"  (1.803 m)   Body mass index is 21.13 kg/m. Wt Readings from Last 3 Encounters:  02/26/22 151 lb 8 oz (68.7 kg)  08/28/21 152 lb (68.9 kg)  08/07/21 150 lb (68 kg)     Physical Exam Vitals reviewed.  Constitutional:      Appearance: Normal appearance.  HENT:     Head: Normocephalic.     Mouth/Throat:     Mouth: Mucous membranes are moist.     Pharynx: Oropharynx is clear.  Eyes:     Extraocular Movements: Extraocular movements intact.     Conjunctiva/sclera: Conjunctivae normal.     Pupils: Pupils are equal, round, and reactive to light.  Cardiovascular:     Rate and Rhythm: Normal rate and regular rhythm.     Pulses: Normal pulses.     Heart sounds: Normal heart sounds.  Pulmonary:     Effort: Pulmonary effort is normal.     Breath sounds: Normal breath sounds.  Abdominal:     Palpations: Abdomen is soft.     Tenderness: There is no abdominal tenderness.  Musculoskeletal:        General: Normal range of motion.     Cervical back: No tenderness.  Lymphadenopathy:     Cervical: No cervical adenopathy.  Skin:    General: Skin is warm and dry.     Capillary Refill: Capillary refill takes less than 2 seconds.  Neurological:     General: No focal deficit present.     Mental Status: He is alert and oriented to person, place, and time.  Psychiatric:        Mood and Affect: Mood normal.        Behavior: Behavior normal.      ASSESSMENT & PLAN: Problem List Items Addressed This Visit       Cardiovascular and Mediastinum   Essential hypertension - Primary    Well-controlled hypertension. Continue metoprolol succinate 50 mg daily. Dietary approaches to stop hypertension discussed. Cardiovascular risks associated with hypertension discussed. Follow-up in 6 months. BP Readings from Last 3 Encounters:  02/26/22 130/80  08/28/21 118/68  08/07/21 114/60         Relevant Medications   atorvastatin (LIPITOR) 40 MG tablet     Other    Dyslipidemia    Stable.  Diet and nutrition discussed.  Continue atorvastatin 40 mg daily. Follow-up in 6 months.      Relevant Medications   atorvastatin (LIPITOR) 40 MG tablet   Current smoker    Cardiovascular and cancer risks associated with smoking discussed.  Smoking cessation advice given.      Patient Instructions  Hypertension, Adult High blood pressure (hypertension) is when the force of blood pumping through the arteries is too strong. The arteries  are the blood vessels that carry blood from the heart throughout the body. Hypertension forces the heart to work harder to pump blood and may cause arteries to become narrow or stiff. Untreated or uncontrolled hypertension can lead to a heart attack, heart failure, a stroke, kidney disease, and other problems. A blood pressure reading consists of a higher number over a lower number. Ideally, your blood pressure should be below 120/80. The first ("top") number is called the systolic pressure. It is a measure of the pressure in your arteries as your heart beats. The second ("bottom") number is called the diastolic pressure. It is a measure of the pressure in your arteries as the heart relaxes. What are the causes? The exact cause of this condition is not known. There are some conditions that result in high blood pressure. What increases the risk? Certain factors may make you more likely to develop high blood pressure. Some of these risk factors are under your control, including: Smoking. Not getting enough exercise or physical activity. Being overweight. Having too much fat, sugar, calories, or salt (sodium) in your diet. Drinking too much alcohol. Other risk factors include: Having a personal history of heart disease, diabetes, high cholesterol, or kidney disease. Stress. Having a family history of high blood pressure and high cholesterol. Having obstructive sleep apnea. Age. The risk increases with age. What are the signs or  symptoms? High blood pressure may not cause symptoms. Very high blood pressure (hypertensive crisis) may cause: Headache. Fast or irregular heartbeats (palpitations). Shortness of breath. Nosebleed. Nausea and vomiting. Vision changes. Severe chest pain, dizziness, and seizures. How is this diagnosed? This condition is diagnosed by measuring your blood pressure while you are seated, with your arm resting on a flat surface, your legs uncrossed, and your feet flat on the floor. The cuff of the blood pressure monitor will be placed directly against the skin of your upper arm at the level of your heart. Blood pressure should be measured at least twice using the same arm. Certain conditions can cause a difference in blood pressure between your right and left arms. If you have a high blood pressure reading during one visit or you have normal blood pressure with other risk factors, you may be asked to: Return on a different day to have your blood pressure checked again. Monitor your blood pressure at home for 1 week or longer. If you are diagnosed with hypertension, you may have other blood or imaging tests to help your health care provider understand your overall risk for other conditions. How is this treated? This condition is treated by making healthy lifestyle changes, such as eating healthy foods, exercising more, and reducing your alcohol intake. You may be referred for counseling on a healthy diet and physical activity. Your health care provider may prescribe medicine if lifestyle changes are not enough to get your blood pressure under control and if: Your systolic blood pressure is above 130. Your diastolic blood pressure is above 80. Your personal target blood pressure may vary depending on your medical conditions, your age, and other factors. Follow these instructions at home: Eating and drinking  Eat a diet that is high in fiber and potassium, and low in sodium, added sugar, and fat. An  example of this eating plan is called the DASH diet. DASH stands for Dietary Approaches to Stop Hypertension. To eat this way: Eat plenty of fresh fruits and vegetables. Try to fill one half of your plate at each meal with fruits and  vegetables. Eat whole grains, such as whole-wheat pasta, brown rice, or whole-grain bread. Fill about one fourth of your plate with whole grains. Eat or drink low-fat dairy products, such as skim milk or low-fat yogurt. Avoid fatty cuts of meat, processed or cured meats, and poultry with skin. Fill about one fourth of your plate with lean proteins, such as fish, chicken without skin, beans, eggs, or tofu. Avoid pre-made and processed foods. These tend to be higher in sodium, added sugar, and fat. Reduce your daily sodium intake. Many people with hypertension should eat less than 1,500 mg of sodium a day. Do not drink alcohol if: Your health care provider tells you not to drink. You are pregnant, may be pregnant, or are planning to become pregnant. If you drink alcohol: Limit how much you have to: 0-1 drink a day for women. 0-2 drinks a day for men. Know how much alcohol is in your drink. In the [place] one drink equals one 12 oz bottle of beer (355 mL), one 5 oz glass of wine (148 mL), or one 1 oz glass of hard liquor (44 mL). Lifestyle  Work with your health care provider to maintain a healthy body weight or to lose weight. Ask what an ideal weight is for you. Get at least 30 minutes of exercise that causes your heart to beat faster (aerobic exercise) most days of the week. Activities may include walking, swimming, or biking. Include exercise to strengthen your muscles (resistance exercise), such as Pilates or lifting weights, as part of your weekly exercise routine. Try to do these types of exercises for 30 minutes at least 3 days a week. Do not use any products that contain nicotine or tobacco. These products include cigarettes, chewing tobacco, and vaping devices,  such as e-cigarettes. If you need help quitting, ask your health care provider. Monitor your blood pressure at home as told by your health care provider. Keep all follow-up visits. This is important. Medicines Take over-the-counter and prescription medicines only as told by your health care provider. Follow directions carefully. Blood pressure medicines must be taken as prescribed. Do not skip doses of blood pressure medicine. Doing this puts you at risk for problems and can make the medicine less effective. Ask your health care provider about side effects or reactions to medicines that you should watch for. Contact a health care provider if you: Think you are having a reaction to a medicine you are taking. Have headaches that keep coming back (recurring). Feel dizzy. Have swelling in your ankles. Have trouble with your vision. Get help right away if you: Develop a severe headache or confusion. Have unusual weakness or numbness. Feel faint. Have severe pain in your chest or abdomen. Vomit repeatedly. Have trouble breathing. These symptoms may be an emergency. Get help right away. Call 911. Do not wait to see if the symptoms will go away. Do not drive yourself to the hospital. Summary Hypertension is when the force of blood pumping through your arteries is too strong. If this condition is not controlled, it may put you at risk for serious complications. Your personal target blood pressure may vary depending on your medical conditions, your age, and other factors. For most people, a normal blood pressure is less than 120/80. Hypertension is treated with lifestyle changes, medicines, or a combination of both. Lifestyle changes include losing weight, eating a healthy, low-sodium diet, exercising more, and limiting alcohol. This information is not intended to replace advice given to you by your health  care provider. Make sure you discuss any questions you have with your health care  provider. Document Revised: 07/11/2021 Document Reviewed: 07/11/2021 Elsevier Patient Education  [place], MD [facility]

## 2022-02-26 NOTE — Assessment & Plan Note (Signed)
Cardiovascular and cancer risks associated with smoking discussed. °Smoking cessation advice given. °

## 2022-02-26 NOTE — Assessment & Plan Note (Signed)
Stable.  Diet and nutrition discussed.  Continue atorvastatin 40 mg daily. Follow-up in 6 months.

## 2022-07-16 DIAGNOSIS — E785 Hyperlipidemia, unspecified: Secondary | ICD-10-CM | POA: Diagnosis not present

## 2022-07-16 DIAGNOSIS — Z8673 Personal history of transient ischemic attack (TIA), and cerebral infarction without residual deficits: Secondary | ICD-10-CM | POA: Diagnosis not present

## 2022-07-16 DIAGNOSIS — Z8249 Family history of ischemic heart disease and other diseases of the circulatory system: Secondary | ICD-10-CM | POA: Diagnosis not present

## 2022-07-16 DIAGNOSIS — R32 Unspecified urinary incontinence: Secondary | ICD-10-CM | POA: Diagnosis not present

## 2022-07-16 DIAGNOSIS — I10 Essential (primary) hypertension: Secondary | ICD-10-CM | POA: Diagnosis not present

## 2022-07-16 DIAGNOSIS — Z7982 Long term (current) use of aspirin: Secondary | ICD-10-CM | POA: Diagnosis not present

## 2022-07-16 DIAGNOSIS — R69 Illness, unspecified: Secondary | ICD-10-CM | POA: Diagnosis not present

## 2022-08-27 ENCOUNTER — Ambulatory Visit: Payer: 59 | Admitting: Emergency Medicine

## 2022-08-27 ENCOUNTER — Encounter: Payer: Self-pay | Admitting: Emergency Medicine

## 2022-08-27 VITALS — BP 134/78 | HR 64 | Temp 98.5°F | Ht 71.0 in | Wt 155.0 lb

## 2022-08-27 DIAGNOSIS — R399 Unspecified symptoms and signs involving the genitourinary system: Secondary | ICD-10-CM

## 2022-08-27 DIAGNOSIS — F17200 Nicotine dependence, unspecified, uncomplicated: Secondary | ICD-10-CM

## 2022-08-27 DIAGNOSIS — I10 Essential (primary) hypertension: Secondary | ICD-10-CM

## 2022-08-27 DIAGNOSIS — E785 Hyperlipidemia, unspecified: Secondary | ICD-10-CM | POA: Diagnosis not present

## 2022-08-27 DIAGNOSIS — R69 Illness, unspecified: Secondary | ICD-10-CM | POA: Diagnosis not present

## 2022-08-27 HISTORY — DX: Unspecified symptoms and signs involving the genitourinary system: R39.9

## 2022-08-27 NOTE — Assessment & Plan Note (Signed)
Active and affecting quality of life. No family history of prostate cancer Current smoker Needs urology evaluation Differential diagnosis discussed including possibility of bladder cancer

## 2022-08-27 NOTE — Patient Instructions (Signed)
Health Maintenance After Age 65 After age 65, you are at a higher risk for certain long-term diseases and infections as well as injuries from falls. Falls are a major cause of broken bones and head injuries in people who are older than age 65. Getting regular preventive care can help to keep you healthy and well. Preventive care includes getting regular testing and making lifestyle changes as recommended by your health care provider. Talk with your health care provider about: Which screenings and tests you should have. A screening is a test that checks for a disease when you have no symptoms. A diet and exercise plan that is right for you. What should I know about screenings and tests to prevent falls? Screening and testing are the best ways to find a health problem early. Early diagnosis and treatment give you the best chance of managing medical conditions that are common after age 65. Certain conditions and lifestyle choices may make you more likely to have a fall. Your health care provider may recommend: Regular vision checks. Poor vision and conditions such as cataracts can make you more likely to have a fall. If you wear glasses, make sure to get your prescription updated if your vision changes. Medicine review. Work with your health care provider to regularly review all of the medicines you are taking, including over-the-counter medicines. Ask your health care provider about any side effects that may make you more likely to have a fall. Tell your health care provider if any medicines that you take make you feel dizzy or sleepy. Strength and balance checks. Your health care provider may recommend certain tests to check your strength and balance while standing, walking, or changing positions. Foot health exam. Foot pain and numbness, as well as not wearing proper footwear, can make you more likely to have a fall. Screenings, including: Osteoporosis screening. Osteoporosis is a condition that causes  the bones to get weaker and break more easily. Blood pressure screening. Blood pressure changes and medicines to control blood pressure can make you feel dizzy. Depression screening. You may be more likely to have a fall if you have a fear of falling, feel depressed, or feel unable to do activities that you used to do. Alcohol use screening. Using too much alcohol can affect your balance and may make you more likely to have a fall. Follow these instructions at home: Lifestyle Do not drink alcohol if: Your health care provider tells you not to drink. If you drink alcohol: Limit how much you have to: 0-1 drink a day for women. 0-2 drinks a day for men. Know how much alcohol is in your drink. In the [place] one drink equals one 12 oz bottle of beer (355 mL), one 5 oz glass of wine (148 mL), or one 1 oz glass of hard liquor (44 mL). Do not use any products that contain nicotine or tobacco. These products include cigarettes, chewing tobacco, and vaping devices, such as e-cigarettes. If you need help quitting, ask your health care provider. Activity  Follow a regular exercise program to stay fit. This will help you maintain your balance. Ask your health care provider what types of exercise are appropriate for you. If you need a cane or walker, use it as recommended by your health care provider. Wear supportive shoes that have nonskid soles. Safety  Remove any tripping hazards, such as rugs, cords, and clutter. Install safety equipment such as grab bars in bathrooms and safety rails on stairs. Keep rooms and walkways   well-lit. General instructions Talk with your health care provider about your risks for falling. Tell your health care provider if: You fall. Be sure to tell your health care provider about all falls, even ones that seem minor. You feel dizzy, tiredness (fatigue), or off-balance. Take over-the-counter and prescription medicines only as told by your health care provider. These include  supplements. Eat a healthy diet and maintain a healthy weight. A healthy diet includes low-fat dairy products, low-fat (lean) meats, and fiber from whole grains, beans, and lots of fruits and vegetables. Stay current with your vaccines. Schedule regular health, dental, and eye exams. Summary Having a healthy lifestyle and getting preventive care can help to protect your health and wellness after age 65. Screening and testing are the best way to find a health problem early and help you avoid having a fall. Early diagnosis and treatment give you the best chance for managing medical conditions that are more common for people who are older than age 65. Falls are a major cause of broken bones and head injuries in people who are older than age 65. Take precautions to prevent a fall at home. Work with your health care provider to learn what changes you can make to improve your health and wellness and to prevent falls. This information is not intended to replace advice given to you by your health care provider. Make sure you discuss any questions you have with your health care provider. Document Revised: 01/23/2021 Document Reviewed: 01/23/2021 Elsevier Patient Education  2023 Elsevier Inc.  

## 2022-08-27 NOTE — Assessment & Plan Note (Signed)
Cardiovascular/cancer risks associated with smoking discussed. Smoking cessation advice given. 

## 2022-08-27 NOTE — Assessment & Plan Note (Signed)
Well-controlled hypertension. Continue metoprolol succinate 50 mg daily. Cardiovascular risks associated with hypertension discussed. Dietary approaches to stop hypertension discussed.

## 2022-08-27 NOTE — Assessment & Plan Note (Signed)
Stable.  Diet and nutrition discussed. Continue atorvastatin 40 mg daily. 

## 2022-08-27 NOTE — Progress Notes (Signed)
Justin Odom. 66 y.o.   Chief Complaint  Patient presents with   57mo follow up    Referral to urology    HISTORY OF PRESENT ILLNESS: This is a 66 y.o. male A1A complaining of lower urinary tract symptoms including urgency and frequency for several months.  Requesting referral to urology. No family history of prostate cancer but still smoking. History of hypertension and dyslipidemia.  BP Readings from Last 3 Encounters:  08/27/22 134/78  02/26/22 130/80  08/28/21 118/68     HPI   Prior to Admission medications   Medication Sig Start Date End Date Taking? Authorizing Provider  aspirin EC 81 MG tablet Take 81 mg by mouth daily.   Yes [provider]  atorvastatin (LIPITOR) 40 MG tablet Take 1 tablet (40 mg total) by mouth daily. 02/26/22  Yes Jazzmin Newbold, Eilleen Kempf, MD  cholecalciferol (VITAMIN D3) 25 MCG (1000 UNIT) tablet Take 1,000 Units by mouth daily.   Yes [provider]  metoprolol succinate (TOPROL-XL) 50 MG 24 hr tablet Take 1 tablet (50 mg total) by mouth daily. 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/04/2020   Essential  hypertension 02/02/2019   H/O TIA (transient ischemic attack) and stroke 11/29/2014   Hyperlipidemia    Premature heartbeats 09/28/2019   Recent urinary tract infection 02/22/2021    Past Surgical History:  Procedure Laterality Date   NO PAST SURGERIES      Social History   Socioeconomic History   Marital status: Divorced    Spouse name: Not on file   Number of children: Not on file   Years of education: Not on file   Highest education level: Not on file  Occupational History   Not on file  Tobacco Use   Smoking status: Some Days    Packs/day: 0.25    Types: Cigarettes    Last attempt to quit: 09/24/2019    Years since quitting: 2.9   Smokeless tobacco: Never   Tobacco comments:    3 cigarettes a day  Vaping Use   Vaping Use: Never used  Substance and Sexual Activity   Alcohol use: Yes    Alcohol/week: 0.0 standard drinks of alcohol    Comment: occ   Drug use: Yes    Types: Marijuana   Sexual activity: Not on file  Other Topics Concern   Not on file  Social History Narrative   Not on file   Social Determinants of Health   Financial Resource Strain: Not on file  Food Insecurity: Not on file  Transportation Needs: Not on file  Physical Activity: Not on file  Stress: Not on file  Social Connections: Not on file  Intimate Partner  Violence: Not on file    Family History  Problem Relation Age of Onset   Cancer Mother    Pancreatic cancer Mother    Cancer Sister    Colon cancer Neg Hx      Review of Systems  Constitutional: Negative.  Negative for chills and fever.  HENT: Negative.  Negative for congestion and sore throat.   Respiratory: Negative.  Negative for cough and wheezing.   Cardiovascular: Negative.  Negative for chest pain and palpitations.  Gastrointestinal:  Negative for nausea and vomiting.  Genitourinary:  Positive for frequency and urgency. Negative for hematuria.  Skin: Negative.  Negative for rash.  Neurological: Negative.  Negative for  dizziness and headaches.   Today's Vitals   08/27/22 0829  BP: 134/78  Pulse: 64  Temp: 98.5 F (36.9 C)  TempSrc: Oral  SpO2: 97%  Weight: 155 lb (70.3 kg)  Height: 5\' 11"  (1.803 m)   Body mass index is 21.62 kg/m.   Physical Exam Vitals reviewed.  Constitutional:      Appearance: Normal appearance.  HENT:     Head: Normocephalic.  Eyes:     Extraocular Movements: Extraocular movements intact.     Conjunctiva/sclera: Conjunctivae normal.     Pupils: Pupils are equal, round, and reactive to light.  Cardiovascular:     Rate and Rhythm: Normal rate and regular rhythm.     Pulses: Normal pulses.     Heart sounds: Normal heart sounds.  Pulmonary:     Effort: Pulmonary effort is normal.     Breath sounds: Normal breath sounds.  Musculoskeletal:     Cervical back: No tenderness.  Lymphadenopathy:     Cervical: No cervical adenopathy.  Skin:    General: Skin is warm and dry.  Neurological:     General: No focal deficit present.     Mental Status: He is alert and oriented to person, place, and time.  Psychiatric:        Mood and Affect: Mood normal.        Behavior: Behavior normal.      ASSESSMENT & PLAN: A total of 45 minutes was spent with the patient and counseling/coordination of care regarding preparing for this visit, review of most recent office visit notes, review of multiple chronic medical conditions and their management, review of all medications, review of most recent blood work results, differential diagnosis of lower urinary tract symptoms and need for urology evaluation, smoking cessation advice, education on nutrition, cardiovascular risks associated with hypertension and dyslipidemia, prognosis, documentation and need for follow-up.  Problem List Items Addressed This Visit       Cardiovascular and Mediastinum   Essential hypertension - Primary    Well-controlled hypertension. Continue metoprolol succinate 50 mg daily. Cardiovascular risks  associated with hypertension discussed. Dietary approaches to stop hypertension discussed.      Relevant Orders   CBC with Differential/Platelet   Comprehensive metabolic panel   Hemoglobin A1c     Other   Dyslipidemia    Stable.  Diet and nutrition discussed. Continue atorvastatin 40 mg daily.       Relevant Orders   Hemoglobin A1c   Lipid panel   Current smoker    Cardiovascular/cancer risks associated with smoking discussed. Smoking cessation advice given.      Lower urinary tract symptoms    Active and affecting quality of life. No family history of prostate cancer Current smoker Needs urology evaluation Differential diagnosis discussed including possibility of bladder cancer  Relevant Orders   Ambulatory referral to Urology   PSA   Patient Instructions  Health Maintenance After Age 49 After age 23, you are at a higher risk for certain long-term diseases and infections as well as injuries from falls. Falls are a major cause of broken bones and head injuries in people who are older than age 73. Getting regular preventive care can help to keep you healthy and well. Preventive care includes getting regular testing and making lifestyle changes as recommended by your health care provider. Talk with your health care provider about: Which screenings and tests you should have. A screening is a test that checks for a disease when you have no symptoms. A diet and exercise plan that is right for you. What should I know about screenings and tests to prevent falls? Screening and testing are the best ways to find a health problem early. Early diagnosis and treatment give you the best chance of managing medical conditions that are common after age 73. Certain conditions and lifestyle choices may make you more likely to have a fall. Your health care provider may recommend: Regular vision checks. Poor vision and conditions such as cataracts can make you more likely to have a fall.  If you wear glasses, make sure to get your prescription updated if your vision changes. Medicine review. Work with your health care provider to regularly review all of the medicines you are taking, including over-the-counter medicines. Ask your health care provider about any side effects that may make you more likely to have a fall. Tell your health care provider if any medicines that you take make you feel dizzy or sleepy. Strength and balance checks. Your health care provider may recommend certain tests to check your strength and balance while standing, walking, or changing positions. Foot health exam. Foot pain and numbness, as well as not wearing proper footwear, can make you more likely to have a fall. Screenings, including: Osteoporosis screening. Osteoporosis is a condition that causes the bones to get weaker and break more easily. Blood pressure screening. Blood pressure changes and medicines to control blood pressure can make you feel dizzy. Depression screening. You may be more likely to have a fall if you have a fear of falling, feel depressed, or feel unable to do activities that you used to do. Alcohol use screening. Using too much alcohol can affect your balance and may make you more likely to have a fall. Follow these instructions at home: Lifestyle Do not drink alcohol if: Your health care provider tells you not to drink. If you drink alcohol: Limit how much you have to: 0-1 drink a day for women. 0-2 drinks a day for men. Know how much alcohol is in your drink. In the [place] one drink equals one 12 oz bottle of beer (355 mL), one 5 oz glass of wine (148 mL), or one 1 oz glass of hard liquor (44 mL). Do not use any products that contain nicotine or tobacco. These products include cigarettes, chewing tobacco, and vaping devices, such as e-cigarettes. If you need help quitting, ask your health care provider. Activity  Follow a regular exercise program to stay fit. This will help  you maintain your balance. Ask your health care provider what types of exercise are appropriate for you. If you need a cane or walker, use it as recommended by your health care provider. Wear supportive shoes that have nonskid soles. Safety  Remove any tripping hazards, such as rugs, cords, and clutter. Install  safety equipment such as grab bars in bathrooms and safety rails on stairs. Keep rooms and walkways well-lit. General instructions Talk with your health care provider about your risks for falling. Tell your health care provider if: You fall. Be sure to tell your health care provider about all falls, even ones that seem minor. You feel dizzy, tiredness (fatigue), or off-balance. Take over-the-counter and prescription medicines only as told by your health care provider. These include supplements. Eat a healthy diet and maintain a healthy weight. A healthy diet includes low-fat dairy products, low-fat (lean) meats, and fiber from whole grains, beans, and lots of fruits and vegetables. Stay current with your vaccines. Schedule regular health, dental, and eye exams. Summary Having a healthy lifestyle and getting preventive care can help to protect your health and wellness after age 65. Screening and testing are the best way to find a health problem early and help you avoid having a fall. Early diagnosis and treatment give you the best chance for managing medical conditions that are more common for people who are older than age 65. Falls are a major cause of broken bones and head injuries in people who are older than age 65. Take precautions to prevent a fall at home. Work with your health care provider to learn what changes you can make to improve your health and wellness and to prevent falls. This information is not intended to replace advice given to you by your health care provider. Make sure you discuss any questions you have with your health care provider. Document Revised: 01/23/2021  Document Reviewed: 01/23/2021 Elsevier Patient Education  2023 Elsevier Inc.    Edwina BarthMiguel Anayi Bricco, MD [facility]

## 2022-08-28 ENCOUNTER — Telehealth: Payer: Self-pay | Admitting: Cardiology

## 2022-08-28 ENCOUNTER — Telehealth: Payer: Self-pay | Admitting: Emergency Medicine

## 2022-08-28 MED ORDER — METOPROLOL SUCCINATE ER 50 MG PO TB24: ORAL_TABLET | ORAL | 0 refills | Status: DC

## 2022-08-28 NOTE — Telephone Encounter (Signed)
*  STAT* If patient is at the pharmacy, call can be transferred to refill team.   1. Which medications need to be refilled? (please list name of each medication and dose if known)   metoprolol succinate (TOPROL-XL) 50 MG 24 hr tablet    2. Which pharmacy/location (including street and city if local pharmacy) is medication to be sent to?   [id number] - [place], [place] - [address]    3. Do they need a 30 day or 90 day supply? 90 .  Pt made an appt for 10/09/22 with Dr. Tomie China

## 2022-08-28 NOTE — Telephone Encounter (Signed)
Caller & Relationship to patient: Self  Call back number: [phone]   Date of last office visit: 12.12.23  Date of next office visit: N/A  Medication(s) to be refilled:  atorvastatin (LIPITOR) 40 MG tablet   Preferred Pharmacy:  [phone]   Phone: [phone]  Fax: [phone]   Pt forgot to make refill request during his appointment yesterday.

## 2022-08-29 ENCOUNTER — Other Ambulatory Visit: Payer: 59

## 2022-08-29 LAB — COMPREHENSIVE METABOLIC PANEL WITH GFR
ALT: 9 U/L (ref 0–53)
AST: 19 U/L (ref 0–37)
Albumin: 4.2 g/dL (ref 3.5–5.2)
Alkaline Phosphatase: 73 U/L (ref 39–117)
BUN: 9 mg/dL (ref 6–23)
CO2: 31 meq/L (ref 19–32)
Calcium: 9.4 mg/dL (ref 8.4–10.5)
Chloride: 101 meq/L (ref 96–112)
Creatinine, Ser: 0.99 mg/dL (ref 0.40–1.50)
GFR: 80.17 mL/min
Glucose, Bld: 99 mg/dL (ref 70–99)
Potassium: 4.3 meq/L (ref 3.5–5.1)
Sodium: 137 meq/L (ref 135–145)
Total Bilirubin: 0.4 mg/dL (ref 0.2–1.2)
Total Protein: 7.2 g/dL (ref 6.0–8.3)

## 2022-08-29 LAB — CBC WITH DIFFERENTIAL/PLATELET
Basophils Absolute: 0.1 10*3/uL (ref 0.0–0.1)
Basophils Relative: 0.9 % (ref 0.0–3.0)
Eosinophils Absolute: 0.1 10*3/uL (ref 0.0–0.7)
Eosinophils Relative: 0.8 % (ref 0.0–5.0)
HCT: 38.4 % — ABNORMAL LOW (ref 39.0–52.0)
Hemoglobin: 12.9 g/dL — ABNORMAL LOW (ref 13.0–17.0)
Lymphocytes Relative: 29.2 % (ref 12.0–46.0)
Lymphs Abs: 2 10*3/uL (ref 0.7–4.0)
MCHC: 33.7 g/dL (ref 30.0–36.0)
MCV: 93.5 fl (ref 78.0–100.0)
Monocytes Absolute: 0.6 10*3/uL (ref 0.1–1.0)
Monocytes Relative: 9.4 % (ref 3.0–12.0)
Neutro Abs: 4 10*3/uL (ref 1.4–7.7)
Neutrophils Relative %: 59.7 % (ref 43.0–77.0)
Platelets: 257 10*3/uL (ref 150.0–400.0)
RBC: 4.11 Mil/uL — ABNORMAL LOW (ref 4.22–5.81)
RDW: 13.8 % (ref 11.5–15.5)
WBC: 6.7 10*3/uL (ref 4.0–10.5)

## 2022-08-29 LAB — LIPID PANEL
Cholesterol: 144 mg/dL (ref 0–200)
HDL: 40.6 mg/dL
LDL Cholesterol: 86 mg/dL (ref 0–99)
NonHDL: 103.49
Total CHOL/HDL Ratio: 4
Triglycerides: 85 mg/dL (ref 0.0–149.0)
VLDL: 17 mg/dL (ref 0.0–40.0)

## 2022-08-29 LAB — PSA: PSA: 5.57 ng/mL — ABNORMAL HIGH (ref 0.10–4.00)

## 2022-08-29 LAB — HEMOGLOBIN A1C: Hgb A1c MFr Bld: 5.1 % (ref 4.6–6.5)

## 2022-10-09 ENCOUNTER — Ambulatory Visit: Payer: Medicare Other | Attending: Cardiology | Admitting: Cardiology

## 2022-10-09 ENCOUNTER — Encounter: Payer: Self-pay | Admitting: Cardiology

## 2022-10-09 VITALS — BP 162/74 | HR 82 | Ht 71.0 in | Wt 155.1 lb

## 2022-10-09 DIAGNOSIS — R931 Abnormal findings on diagnostic imaging of heart and coronary circulation: Secondary | ICD-10-CM | POA: Insufficient documentation

## 2022-10-09 DIAGNOSIS — F17200 Nicotine dependence, unspecified, uncomplicated: Secondary | ICD-10-CM | POA: Diagnosis present

## 2022-10-09 DIAGNOSIS — I10 Essential (primary) hypertension: Secondary | ICD-10-CM | POA: Diagnosis present

## 2022-10-09 DIAGNOSIS — E782 Mixed hyperlipidemia: Secondary | ICD-10-CM | POA: Diagnosis present

## 2022-10-09 NOTE — Patient Instructions (Signed)
Medication Instructions:  Your physician recommends that you continue on your current medications as directed. Please refer to the Current Medication list given to you today.  *If you need a refill on your cardiac medications before your next appointment, please call your pharmacy*   Lab Work: None ordered If you have labs (blood work) drawn today and your tests are completely normal, you will receive your results only by: MyChart Message (if you have MyChart) OR A paper copy in the mail If you have any lab test that is abnormal or we need to change your treatment, we will call you to review the results.   Testing/Procedures: None ordered   Follow-Up: At [facility], you and your health needs are our priority.  As part of our continuing mission to provide you with exceptional heart care, we have created designated Provider Care Teams.  These Care Teams include your primary Cardiologist (physician) and Advanced Practice Providers (APPs -  Physician Assistants and Nurse Practitioners) who all work together to provide you with the care you need, when you need it.  We recommend signing up for the patient portal called "MyChart".  Sign up information is provided on this After Visit Summary.  MyChart is used to connect with patients for Virtual Visits (Telemedicine).  Patients are able to view lab/test results, encounter notes, upcoming appointments, etc.  Non-urgent messages can be sent to your provider as well.   To learn more about what you can do with MyChart, go to [email or web address].    Your next appointment:   12 month(s)  The format for your next appointment:   In Person  Provider:   Rajan Revankar, MD    Other Instructions none  Important Information About Sugar      

## 2022-10-09 NOTE — Progress Notes (Signed)
Cardiology Office Note:    Date:  10/09/2022   ID:  Justin Pies., DOB Aug 09, 1956, MRN [id number]  PCP:  Horald Pollen, MD  Cardiologist:  Jenean Lindau, MD   Referring MD: [place], [place]:    1. Essential hypertension   2. Elevated coronary artery calcium score   3. Mixed hyperlipidemia   4. Current smoker    PLAN:    In order of problems listed above:  Elevated calcium score: Secondary prevention stressed with the patient.  Importance of compliance with diet medication stressed any vocalized understanding.  He was advised to exercise and walk at least half an hour a day 5 days a week and he promises to do so. Essential hypertension: Blood pressure stable and diet was emphasized.  He mentions to me that his blood pressures number are fine at home and he told me the numbers and they are acceptable.  Lifestyle modification urged.  Salt intake issues were discussed. Mixed dyslipidemia: Lipids followed by primary care.  I reviewed this with him.  And diet was emphasized. Cigarette smoker: I spent 5 minutes with the patient discussing solely about smoking. Smoking cessation was counseled. I suggested to the patient also different medications and pharmacological interventions. Patient is keen to try stopping on its own at this time. He will get back to me if he needs any further assistance in this matter. Patient will be seen in follow-up appointment in 6 months or earlier if the patient has any concerns    Medication Adjustments/Labs and Tests Ordered: Current medicines are reviewed at length with the patient today.  Concerns regarding medicines are outlined above.  No orders of the defined types were placed in this encounter.  No orders of the defined types were placed in this encounter.    No chief complaint on file.    History of Present Illness:    Justin Minion. is a 67 y.o. male.  Patient has past medical history of elevated  calcium score, essential hypertension, history of TIA and cigarette smoking.  Unfortunately continues to smoke.  No chest pain orthopnea or PND.  He leads a sedentary lifestyle also and does not exercise on a regular basis.  At the time of my evaluation, the patient is alert awake oriented and in no distress.  Past Medical History:  Diagnosis Date   Current smoker 02/18/2017   Dependence on nicotine from other tobacco product 10/24/2015   DJD (degenerative joint disease) of cervical spine 07/04/2016   Radiographs 05/28/2016: Mild to moderate disc space loss with mild endplate spurring at I7-P8.   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/04/2020   Essential hypertension 02/02/2019   H/O TIA (transient ischemic attack) and stroke 11/29/2014   Hyperlipidemia    Lower urinary tract symptoms 08/27/2022   Premature heartbeats 09/28/2019    Past Surgical History:  Procedure Laterality Date   NO PAST SURGERIES      Current Medications: Current Meds  Medication Sig   aspirin EC 81 MG tablet Take 81 mg by mouth daily.   atorvastatin (LIPITOR) 40 MG tablet Take 1 tablet (40 mg total) by mouth daily.   cholecalciferol (VITAMIN D3) 25 MCG (1000 UNIT) tablet Take 1,000 Units by mouth daily.   metoprolol succinate (TOPROL-XL) 50 MG 24 hr tablet Take 1 tablet daily. Must keep 10/09/2022 appt for additional refills   tamsulosin (FLOMAX) 0.4 MG CAPS capsule Take 0.4 mg by mouth at bedtime.  Allergies:   Patient has no known allergies.   Social History   Socioeconomic History   Marital status: Divorced    Spouse name: Not on file   Number of children: Not on file   Years of education: Not on file   Highest education level: Not on file  Occupational History   Not on file  Tobacco Use   Smoking status: Some Days    Packs/day: 0.25    Types: Cigarettes    Last attempt to quit: 09/24/2019    Years since quitting: 3.0   Smokeless tobacco: Never   Tobacco comments:     3 cigarettes a day  Vaping Use   Vaping Use: Never used  Substance and Sexual Activity   Alcohol use: Yes    Alcohol/week: 0.0 standard drinks of alcohol    Comment: occ   Drug use: Yes    Types: Marijuana   Sexual activity: Not on file  Other Topics Concern   Not on file  Social History Narrative   Not on file   Social Determinants of Health   Financial Resource Strain: Not on file  Food Insecurity: Not on file  Transportation Needs: Not on file  Physical Activity: Not on file  Stress: Not on file  Social Connections: Not on file     Family History: The patient's family history includes Cancer in his mother and sister; Pancreatic cancer in his mother. There is no history of Colon cancer.  ROS:   Please see the history of present illness.    All other systems reviewed and are negative.  EKGs/Labs/Other Studies Reviewed:    The following studies were reviewed today: EKG reveals sinus rhythm and nonspecific ST-T changes   Recent Labs: 08/29/2022: ALT 9; BUN 9; Creatinine, Ser 0.99; Hemoglobin 12.9; Platelets 257.0; Potassium 4.3; Sodium 137  Recent Lipid Panel    Component Value Date/Time   CHOL 144 08/29/2022 0855   CHOL 126 08/07/2021 0904   TRIG 85.0 08/29/2022 0855   HDL 40.60 08/29/2022 0855   HDL 38 (L) 08/07/2021 0904   CHOLHDL 4 08/29/2022 0855   VLDL 17.0 08/29/2022 0855   LDLCALC 86 08/29/2022 0855   LDLCALC 77 08/07/2021 0904    Physical Exam:    VS:  BP (!) 162/74   Pulse 82   Ht 5\' 11"  (1.803 m)   Wt 155 lb 1.9 oz (70.4 kg)   SpO2 99%   BMI 21.63 kg/m     Wt Readings from Last 3 Encounters:  10/09/22 155 lb 1.9 oz (70.4 kg)  08/27/22 155 lb (70.3 kg)  02/26/22 151 lb 8 oz (68.7 kg)     GEN: Patient is in no acute distress HEENT: Normal NECK: No JVD; No carotid bruits LYMPHATICS: No lymphadenopathy CARDIAC: Hear sounds regular, 2/6 systolic murmur at the apex. RESPIRATORY:  Clear to auscultation without rales, wheezing or  rhonchi  ABDOMEN: Soft, non-tender, non-distended MUSCULOSKELETAL:  No edema; No deformity  SKIN: Warm and dry NEUROLOGIC:  Alert and oriented x 3 PSYCHIATRIC:  Normal affect   Signed, Jenean Lindau, MD  10/09/2022 11:20 AM    [place]

## 2023-01-02 ENCOUNTER — Telehealth: Payer: Self-pay | Admitting: Cardiology

## 2023-01-02 MED ORDER — METOPROLOL SUCCINATE ER 50 MG PO TB24: ORAL_TABLET | ORAL | 9 refills | Status: DC

## 2023-01-02 NOTE — Telephone Encounter (Signed)
Rx sent 

## 2023-01-02 NOTE — Telephone Encounter (Signed)
*  STAT* If patient is at the pharmacy, call can be transferred to refill team.   1. Which medications need to be refilled? (please list name of each medication and dose if known)   metoprolol succinate (TOPROL-XL) 50 MG 24 hr tablet   2. Which pharmacy/location (including street and city if local pharmacy) is medication to be sent to? [facility] on [address] in [place]  3. Do they need a 30 day or 90 day supply? 30 day

## 2023-03-31 ENCOUNTER — Other Ambulatory Visit: Payer: Self-pay | Admitting: Emergency Medicine

## 2023-03-31 DIAGNOSIS — E785 Hyperlipidemia, unspecified: Secondary | ICD-10-CM

## 2023-08-26 DIAGNOSIS — Z008 Encounter for other general examination: Secondary | ICD-10-CM | POA: Diagnosis not present

## 2023-09-23 ENCOUNTER — Ambulatory Visit (INDEPENDENT_AMBULATORY_CARE_PROVIDER_SITE_OTHER): Payer: Medicare HMO | Admitting: Emergency Medicine

## 2023-09-23 ENCOUNTER — Encounter: Payer: Self-pay | Admitting: Emergency Medicine

## 2023-09-23 VITALS — BP 152/80 | HR 65 | Temp 98.0°F | Ht 71.0 in | Wt 155.0 lb

## 2023-09-23 DIAGNOSIS — Z8673 Personal history of transient ischemic attack (TIA), and cerebral infarction without residual deficits: Secondary | ICD-10-CM

## 2023-09-23 DIAGNOSIS — F17200 Nicotine dependence, unspecified, uncomplicated: Secondary | ICD-10-CM | POA: Diagnosis not present

## 2023-09-23 DIAGNOSIS — E785 Hyperlipidemia, unspecified: Secondary | ICD-10-CM

## 2023-09-23 DIAGNOSIS — R399 Unspecified symptoms and signs involving the genitourinary system: Secondary | ICD-10-CM

## 2023-09-23 DIAGNOSIS — I10 Essential (primary) hypertension: Secondary | ICD-10-CM | POA: Diagnosis not present

## 2023-09-23 LAB — CBC WITH DIFFERENTIAL/PLATELET
Basophils Absolute: 0.1 10*3/uL (ref 0.0–0.1)
Basophils Relative: 1 % (ref 0.0–3.0)
Eosinophils Absolute: 0.1 10*3/uL (ref 0.0–0.7)
Eosinophils Relative: 0.9 % (ref 0.0–5.0)
HCT: 39.7 % (ref 39.0–52.0)
Hemoglobin: 13.2 g/dL (ref 13.0–17.0)
Lymphocytes Relative: 35.3 % (ref 12.0–46.0)
Lymphs Abs: 2 10*3/uL (ref 0.7–4.0)
MCHC: 33.2 g/dL (ref 30.0–36.0)
MCV: 96.1 fL (ref 78.0–100.0)
Monocytes Absolute: 0.6 10*3/uL (ref 0.1–1.0)
Monocytes Relative: 9.7 % (ref 3.0–12.0)
Neutro Abs: 3.1 10*3/uL (ref 1.4–7.7)
Neutrophils Relative %: 53.1 % (ref 43.0–77.0)
Platelets: 276 10*3/uL (ref 150.0–400.0)
RBC: 4.13 Mil/uL — ABNORMAL LOW (ref 4.22–5.81)
RDW: 14 % (ref 11.5–15.5)
WBC: 5.8 10*3/uL (ref 4.0–10.5)

## 2023-09-23 LAB — LIPID PANEL
Cholesterol: 158 mg/dL (ref 0–200)
HDL: 47.9 mg/dL (ref >39.00)
LDL Cholesterol: 102 mg/dL — ABNORMAL HIGH (ref 0–99)
NonHDL: 110.47
Total CHOL/HDL Ratio: 3
Triglycerides: 41 mg/dL (ref 0.0–149.0)
VLDL: 8.2 mg/dL (ref 0.0–40.0)

## 2023-09-23 LAB — COMPREHENSIVE METABOLIC PANEL
ALT: 5 U/L (ref 0–53)
AST: 15 U/L (ref 0–37)
Albumin: 4.3 g/dL (ref 3.5–5.2)
Alkaline Phosphatase: 79 U/L (ref 39–117)
BUN: 15 mg/dL (ref 6–23)
CO2: 31 meq/L (ref 19–32)
Calcium: 9.5 mg/dL (ref 8.4–10.5)
Chloride: 102 meq/L (ref 96–112)
Creatinine, Ser: 0.95 mg/dL (ref 0.40–1.50)
GFR: 83.6 mL/min (ref >60.00)
Glucose, Bld: 79 mg/dL (ref 70–99)
Potassium: 4.5 meq/L (ref 3.5–5.1)
Sodium: 138 meq/L (ref 135–145)
Total Bilirubin: 0.5 mg/dL (ref 0.2–1.2)
Total Protein: 7.4 g/dL (ref 6.0–8.3)

## 2023-09-23 LAB — HEMOGLOBIN A1C: Hgb A1c MFr Bld: 4.9 % (ref 4.6–6.5)

## 2023-09-23 LAB — PSA: PSA: 8.15 ng/mL — ABNORMAL HIGH (ref 0.10–4.00)

## 2023-09-23 MED ORDER — AMLODIPINE BESYLATE 5 MG PO TABS: 5.0000 mg | ORAL_TABLET | Freq: Every day | ORAL | 3 refills | Status: DC

## 2023-09-23 NOTE — Assessment & Plan Note (Signed)
 Secondary prevention discussed.  Continue baby aspirin  daily. Continue atorvastatin  40 mg daily.  Encouraged to stop smoking. Dangers of uncontrolled hypertension discussed Recommend to continue metoprolol  succinate 50 mg daily and start amlodipine  5 mg daily

## 2023-09-23 NOTE — Assessment & Plan Note (Signed)
 Elevated PSA 1 year ago Was able to follow-up with urologist once but did not follow-up after that Still taking tamsulosin 0.4 mg daily with good results of lower urinary tract symptoms Will repeat PSA today and recommend urology evaluation again

## 2023-09-23 NOTE — Assessment & Plan Note (Addendum)
 Stopped smoking 6 days ago Cardiovascular and cancer risks associated with smoking discussed Smoking cessation advice given

## 2023-09-23 NOTE — Progress Notes (Signed)
 Justin Odom. 68 y.o.   Chief Complaint  Patient presents with   Follow-up    Patient states he is having a constant runny nose. Pt states he did go see urinologist but stop going be the dr reminded him of his son.( This was a year ago). Has questions about his meds     HISTORY OF PRESENT ILLNESS: This is a 68 y.o. male here for follow-up of multiple chronic medical conditions including hypertension and dyslipidemia Chronic smoker.  Stopped 6 days ago.  New Year's resolution. PSA elevated about a year ago.  Did follow-up with urologist but did not like young doctor who recommended prostate biopsy No family history of prostate cancer.  Has no lower urinary tract symptoms since starting Flomax . Overall doing well.  Has no complaints or medical concerns today. BP Readings from Last 3 Encounters:  09/23/23 (!) 152/80  10/09/22 (!) 162/74  08/27/22 134/78     HPI   Prior to Admission medications   Medication Sig Start Date End Date Taking? Authorizing Provider  aspirin  EC 81 MG tablet Take 81 mg by mouth daily.   Yes [provider]  atorvastatin  (LIPITOR) 40 MG tablet TAKE 1 TABLET BY MOUTH EVERY DAY 03/31/23  Yes Lynda Wanninger, Emil Schanz, MD  cholecalciferol (VITAMIN D3) 25 MCG (1000 UNIT) tablet Take 1,000 Units by mouth daily.   Yes [provider]  metoprolol  succinate (TOPROL -XL) 50 MG 24 hr tablet Take 1 tablet daily. Must keep 10/09/2022 appt for additional refills 01/02/23  Yes Revankar, Jennifer SAUNDERS, MD  tamsulosin  (FLOMAX ) 0.4 MG CAPS capsule Take 0.4 mg by mouth at bedtime. 08/31/22  Yes [provider]    No Known Allergies  Patient Active Problem List   Diagnosis Date Noted   Lower urinary tract symptoms 08/27/2022   Elevated coronary artery calcium  score 07/04/2020   Hyperlipidemia 10/05/2019   Premature heartbeats 09/28/2019   Essential hypertension 02/02/2019   Current smoker 02/18/2017   DJD (degenerative joint disease) of cervical spine  07/04/2016   Dependence on nicotine  from other tobacco product 10/24/2015   Dyslipidemia 05/02/2015   H/O TIA (transient ischemic attack) and stroke 11/29/2014    Past Medical History:  Diagnosis Date   Current smoker 02/18/2017   Dependence on nicotine  from other tobacco product 10/24/2015   DJD (degenerative joint disease) of cervical spine 07/04/2016   Radiographs 05/28/2016: Mild to moderate disc space loss with mild endplate spurring at C5-C6.   Dyslipidemia 05/02/2015   Elevated coronary artery calcium  score 07/04/2020   Essential hypertension 02/02/2019   H/O TIA (transient ischemic attack) and stroke 11/29/2014   Hyperlipidemia    Lower urinary tract symptoms 08/27/2022   Premature heartbeats 09/28/2019    Past Surgical History:  Procedure Laterality Date   NO PAST SURGERIES      Social History   Socioeconomic History   Marital status: Divorced    Spouse name: Not on file   Number of children: Not on file   Years of education: Not on file   Highest education level: Not on file  Occupational History   Not on file  Tobacco Use   Smoking status: Some Days    Current packs/day: 0.00    Types: Cigarettes    Last attempt to quit: 09/24/2019    Years since quitting: 4.0   Smokeless tobacco: Never   Tobacco comments:    3 cigarettes a day  Vaping Use   Vaping status: Never Used  Substance and Sexual Activity  Alcohol use: Yes    Alcohol/week: 0.0 standard drinks of alcohol    Comment: occ   Drug use: Yes    Types: Marijuana   Sexual activity: Not on file  Other Topics Concern   Not on file  Social History Narrative   Not on file   Social Drivers of Health   Financial Resource Strain: Not on file  Food Insecurity: Not on file  Transportation Needs: Not on file  Physical Activity: Not on file  Stress: Not on file  Social Connections: Not on file  Intimate Partner Violence: Not on file    Family History  Problem Relation Age of Onset   Cancer Mother     Pancreatic cancer Mother    Cancer Sister    Colon cancer Neg Hx      Review of Systems  Constitutional: Negative.  Negative for chills and fever.  HENT: Negative.  Negative for congestion and sore throat.   Respiratory: Negative.  Negative for cough and shortness of breath.   Cardiovascular: Negative.  Negative for chest pain and palpitations.  Gastrointestinal:  Negative for abdominal pain, diarrhea, nausea and vomiting.  Genitourinary: Negative.  Negative for dysuria and hematuria.  Skin: Negative.  Negative for rash.  Neurological: Negative.  Negative for dizziness and headaches.  All other systems reviewed and are negative.   Today's Vitals   09/23/23 1419  BP: (!) 152/80  Pulse: 65  Temp: 98 F (36.7 C)  TempSrc: Oral  SpO2: 96%  Weight: 155 lb (70.3 kg)  Height: 5' 11 (1.803 m)   Body mass index is 21.62 kg/m.   Physical Exam Vitals reviewed.  Constitutional:      Appearance: Normal appearance.  HENT:     Head: Normocephalic.     Mouth/Throat:     Mouth: Mucous membranes are moist.     Pharynx: Oropharynx is clear.  Eyes:     Extraocular Movements: Extraocular movements intact.     Conjunctiva/sclera: Conjunctivae normal.     Pupils: Pupils are equal, round, and reactive to light.  Cardiovascular:     Rate and Rhythm: Normal rate and regular rhythm.     Pulses: Normal pulses.     Heart sounds: Normal heart sounds.  Pulmonary:     Effort: Pulmonary effort is normal.     Breath sounds: Normal breath sounds.  Musculoskeletal:     Cervical back: No tenderness.  Lymphadenopathy:     Cervical: No cervical adenopathy.  Skin:    General: Skin is warm and dry.     Capillary Refill: Capillary refill takes less than 2 seconds.  Neurological:     General: No focal deficit present.     Mental Status: He is alert and oriented to person, place, and time.  Psychiatric:        Mood and Affect: Mood normal.        Behavior: Behavior normal.       ASSESSMENT & PLAN: A total of 44 minutes was spent with the patient and counseling/coordination of care regarding preparing for this visit, review of most recent office visit notes, review of multiple chronic medical conditions and their management, cardiovascular risks associated with uncontrolled hypertension, review of all medications and changes made, review of most recent bloodwork results, review of health maintenance items, education on nutrition, prognosis, documentation, and need for follow up.     Patient Instructions  Hypertension, Adult High blood pressure (hypertension) is when the force of blood pumping through the arteries  is too strong. The arteries are the blood vessels that carry blood from the heart throughout the body. Hypertension forces the heart to work harder to pump blood and may cause arteries to become narrow or stiff. Untreated or uncontrolled hypertension can lead to a heart attack, heart failure, a stroke, kidney disease, and other problems. A blood pressure reading consists of a higher number over a lower number. Ideally, your blood pressure should be below 120/80. The first (top) number is called the systolic pressure. It is a measure of the pressure in your arteries as your heart beats. The second (bottom) number is called the diastolic pressure. It is a measure of the pressure in your arteries as the heart relaxes. What are the causes? The exact cause of this condition is not known. There are some conditions that result in high blood pressure. What increases the risk? Certain factors may make you more likely to develop high blood pressure. Some of these risk factors are under your control, including: Smoking. Not getting enough exercise or physical activity. Being overweight. Having too much fat, sugar, calories, or salt (sodium) in your diet. Drinking too much alcohol. Other risk factors include: Having a personal history of heart disease,  diabetes, high cholesterol, or kidney disease. Stress. Having a family history of high blood pressure and high cholesterol. Having obstructive sleep apnea. Age. The risk increases with age. What are the signs or symptoms? High blood pressure may not cause symptoms. Very high blood pressure (hypertensive crisis) may cause: Headache. Fast or irregular heartbeats (palpitations). Shortness of breath. Nosebleed. Nausea and vomiting. Vision changes. Severe chest pain, dizziness, and seizures. How is this diagnosed? This condition is diagnosed by measuring your blood pressure while you are seated, with your arm resting on a flat surface, your legs uncrossed, and your feet flat on the floor. The cuff of the blood pressure monitor will be placed directly against the skin of your upper arm at the level of your heart. Blood pressure should be measured at least twice using the same arm. Certain conditions can cause a difference in blood pressure between your right and left arms. If you have a high blood pressure reading during one visit or you have normal blood pressure with other risk factors, you may be asked to: Return on a different day to have your blood pressure checked again. Monitor your blood pressure at home for 1 week or longer. If you are diagnosed with hypertension, you may have other blood or imaging tests to help your health care provider understand your overall risk for other conditions. How is this treated? This condition is treated by making healthy lifestyle changes, such as eating healthy foods, exercising more, and reducing your alcohol intake. You may be referred for counseling on a healthy diet and physical activity. Your health care provider may prescribe medicine if lifestyle changes are not enough to get your blood pressure under control and if: Your systolic blood pressure is above 130. Your diastolic blood pressure is above 80. Your personal target blood pressure may vary  depending on your medical conditions, your age, and other factors. Follow these instructions at home: Eating and drinking  Eat a diet that is high in fiber and potassium, and low in sodium, added sugar, and fat. An example of this eating plan is called the DASH diet. DASH stands for Dietary Approaches to Stop Hypertension. To eat this way: Eat plenty of fresh fruits and vegetables. Try to fill one half of your plate at  each meal with fruits and vegetables. Eat whole grains, such as whole-wheat pasta, brown rice, or whole-grain bread. Fill about one fourth of your plate with whole grains. Eat or drink low-fat dairy products, such as skim milk or low-fat yogurt. Avoid fatty cuts of meat, processed or cured meats, and poultry with skin. Fill about one fourth of your plate with lean proteins, such as fish, chicken without skin, beans, eggs, or tofu. Avoid pre-made and processed foods. These tend to be higher in sodium, added sugar, and fat. Reduce your daily sodium intake. Many people with hypertension should eat less than 1,500 mg of sodium a day. Do not drink alcohol if: Your health care provider tells you not to drink. You are pregnant, may be pregnant, or are planning to become pregnant. If you drink alcohol: Limit how much you have to: 0-1 drink a day for women. 0-2 drinks a day for men. Know how much alcohol is in your drink. In the [place] one drink equals one 12 oz bottle of beer (355 mL), one 5 oz glass of wine (148 mL), or one 1 oz glass of hard liquor (44 mL). Lifestyle  Work with your health care provider to maintain a healthy body weight or to lose weight. Ask what an ideal weight is for you. Get at least 30 minutes of exercise that causes your heart to beat faster (aerobic exercise) most days of the week. Activities may include walking, swimming, or biking. Include exercise to strengthen your muscles (resistance exercise), such as Pilates or lifting weights, as part of your weekly  exercise routine. Try to do these types of exercises for 30 minutes at least 3 days a week. Do not use any products that contain nicotine  or tobacco. These products include cigarettes, chewing tobacco, and vaping devices, such as e-cigarettes. If you need help quitting, ask your health care provider. Monitor your blood pressure at home as told by your health care provider. Keep all follow-up visits. This is important. Medicines Take over-the-counter and prescription medicines only as told by your health care provider. Follow directions carefully. Blood pressure medicines must be taken as prescribed. Do not skip doses of blood pressure medicine. Doing this puts you at risk for problems and can make the medicine less effective. Ask your health care provider about side effects or reactions to medicines that you should watch for. Contact a health care provider if you: Think you are having a reaction to a medicine you are taking. Have headaches that keep coming back (recurring). Feel dizzy. Have swelling in your ankles. Have trouble with your vision. Get help right away if you: Develop a severe headache or confusion. Have unusual weakness or numbness. Feel faint. Have severe pain in your chest or abdomen. Vomit repeatedly. Have trouble breathing. These symptoms may be an emergency. Get help right away. Call 911. Do not wait to see if the symptoms will go away. Do not drive yourself to the hospital. Summary Hypertension is when the force of blood pumping through your arteries is too strong. If this condition is not controlled, it may put you at risk for serious complications. Your personal target blood pressure may vary depending on your medical conditions, your age, and other factors. For most people, a normal blood pressure is less than 120/80. Hypertension is treated with lifestyle changes, medicines, or a combination of both. Lifestyle changes include losing weight, eating a healthy,  low-sodium diet, exercising more, and limiting alcohol. This information is not intended to replace advice  given to you by your health care provider. Make sure you discuss any questions you have with your health care provider. Document Revised: 07/11/2021 Document Reviewed: 07/11/2021 Elsevier Patient Education  2024 [employer]      Emil Schaumann, MD [facility]

## 2023-09-23 NOTE — Assessment & Plan Note (Signed)
 Uncontrolled hypertension. Cardiovascular risks associated with hypertension discussed Dietary approaches to stop hypertension discussed Benefits of exercise discussed Recommend to continue metoprolol  succinate 50 mg daily and start amlodipine  5 mg daily Blood work done today Will follow-up in 3 months

## 2023-09-23 NOTE — Assessment & Plan Note (Signed)
Stable.  Diet and nutrition discussed. Continue atorvastatin 40 mg daily. 

## 2023-09-23 NOTE — Patient Instructions (Signed)
 Hypertension, Adult High blood pressure (hypertension) is when the force of blood pumping through the arteries is too strong. The arteries are the blood vessels that carry blood from the heart throughout the body. Hypertension forces the heart to work harder to pump blood and may cause arteries to become narrow or stiff. Untreated or uncontrolled hypertension can lead to a heart attack, heart failure, a stroke, kidney disease, and other problems. A blood pressure reading consists of a higher number over a lower number. Ideally, your blood pressure should be below 120/80. The first ("top") number is called the systolic pressure. It is a measure of the pressure in your arteries as your heart beats. The second ("bottom") number is called the diastolic pressure. It is a measure of the pressure in your arteries as the heart relaxes. What are the causes? The exact cause of this condition is not known. There are some conditions that result in high blood pressure. What increases the risk? Certain factors may make you more likely to develop high blood pressure. Some of these risk factors are under your control, including: Smoking. Not getting enough exercise or physical activity. Being overweight. Having too much fat, sugar, calories, or salt (sodium) in your diet. Drinking too much alcohol. Other risk factors include: Having a personal history of heart disease, diabetes, high cholesterol, or kidney disease. Stress. Having a family history of high blood pressure and high cholesterol. Having obstructive sleep apnea. Age. The risk increases with age. What are the signs or symptoms? High blood pressure may not cause symptoms. Very high blood pressure (hypertensive crisis) may cause: Headache. Fast or irregular heartbeats (palpitations). Shortness of breath. Nosebleed. Nausea and vomiting. Vision changes. Severe chest pain, dizziness, and seizures. How is this diagnosed? This condition is diagnosed by  measuring your blood pressure while you are seated, with your arm resting on a flat surface, your legs uncrossed, and your feet flat on the floor. The cuff of the blood pressure monitor will be placed directly against the skin of your upper arm at the level of your heart. Blood pressure should be measured at least twice using the same arm. Certain conditions can cause a difference in blood pressure between your right and left arms. If you have a high blood pressure reading during one visit or you have normal blood pressure with other risk factors, you may be asked to: Return on a different day to have your blood pressure checked again. Monitor your blood pressure at home for 1 week or longer. If you are diagnosed with hypertension, you may have other blood or imaging tests to help your health care provider understand your overall risk for other conditions. How is this treated? This condition is treated by making healthy lifestyle changes, such as eating healthy foods, exercising more, and reducing your alcohol intake. You may be referred for counseling on a healthy diet and physical activity. Your health care provider may prescribe medicine if lifestyle changes are not enough to get your blood pressure under control and if: Your systolic blood pressure is above 130. Your diastolic blood pressure is above 80. Your personal target blood pressure may vary depending on your medical conditions, your age, and other factors. Follow these instructions at home: Eating and drinking  Eat a diet that is high in fiber and potassium, and low in sodium, added sugar, and fat. An example of this eating plan is called the DASH diet. DASH stands for Dietary Approaches to Stop Hypertension. To eat this way: Eat  plenty of fresh fruits and vegetables. Try to fill one half of your plate at each meal with fruits and vegetables. Eat whole grains, such as whole-wheat pasta, brown rice, or whole-grain bread. Fill about one  fourth of your plate with whole grains. Eat or drink low-fat dairy products, such as skim milk or low-fat yogurt. Avoid fatty cuts of meat, processed or cured meats, and poultry with skin. Fill about one fourth of your plate with lean proteins, such as fish, chicken without skin, beans, eggs, or tofu. Avoid pre-made and processed foods. These tend to be higher in sodium, added sugar, and fat. Reduce your daily sodium intake. Many people with hypertension should eat less than 1,500 mg of sodium a day. Do not drink alcohol if: Your health care provider tells you not to drink. You are pregnant, may be pregnant, or are planning to become pregnant. If you drink alcohol: Limit how much you have to: 0-1 drink a day for women. 0-2 drinks a day for men. Know how much alcohol is in your drink. In the [place] one drink equals one 12 oz bottle of beer (355 mL), one 5 oz glass of wine (148 mL), or one 1 oz glass of hard liquor (44 mL). Lifestyle  Work with your health care provider to maintain a healthy body weight or to lose weight. Ask what an ideal weight is for you. Get at least 30 minutes of exercise that causes your heart to beat faster (aerobic exercise) most days of the week. Activities may include walking, swimming, or biking. Include exercise to strengthen your muscles (resistance exercise), such as Pilates or lifting weights, as part of your weekly exercise routine. Try to do these types of exercises for 30 minutes at least 3 days a week. Do not use any products that contain nicotine or tobacco. These products include cigarettes, chewing tobacco, and vaping devices, such as e-cigarettes. If you need help quitting, ask your health care provider. Monitor your blood pressure at home as told by your health care provider. Keep all follow-up visits. This is important. Medicines Take over-the-counter and prescription medicines only as told by your health care provider. Follow directions carefully. Blood  pressure medicines must be taken as prescribed. Do not skip doses of blood pressure medicine. Doing this puts you at risk for problems and can make the medicine less effective. Ask your health care provider about side effects or reactions to medicines that you should watch for. Contact a health care provider if you: Think you are having a reaction to a medicine you are taking. Have headaches that keep coming back (recurring). Feel dizzy. Have swelling in your ankles. Have trouble with your vision. Get help right away if you: Develop a severe headache or confusion. Have unusual weakness or numbness. Feel faint. Have severe pain in your chest or abdomen. Vomit repeatedly. Have trouble breathing. These symptoms may be an emergency. Get help right away. Call 911. Do not wait to see if the symptoms will go away. Do not drive yourself to the hospital. Summary Hypertension is when the force of blood pumping through your arteries is too strong. If this condition is not controlled, it may put you at risk for serious complications. Your personal target blood pressure may vary depending on your medical conditions, your age, and other factors. For most people, a normal blood pressure is less than 120/80. Hypertension is treated with lifestyle changes, medicines, or a combination of both. Lifestyle changes include losing weight, eating a healthy,  low-sodium diet, exercising more, and limiting alcohol. This information is not intended to replace advice given to you by your health care provider. Make sure you discuss any questions you have with your health care provider. Document Revised: 07/11/2021 Document Reviewed: 07/11/2021 Elsevier Patient Education  2024 [employer].

## 2023-09-24 ENCOUNTER — Telehealth: Payer: Self-pay

## 2023-09-24 ENCOUNTER — Other Ambulatory Visit: Payer: Self-pay | Admitting: Emergency Medicine

## 2023-09-24 DIAGNOSIS — R972 Elevated prostate specific antigen [PSA]: Secondary | ICD-10-CM

## 2023-09-24 NOTE — Telephone Encounter (Signed)
 Copied from CRM [id number]. Topic: Clinical - Prescription Issue >> Sep 24, 2023  3:00 PM Corean R wrote:  Reason for CRM: Patient is requesting Dr. Purcell to re-fill his tamsulosin  (FLOMAX ) 0.4 MG CAPS capsule  Patient is seeking confirmation on if he is to be taking both metoprolol  and amolodipine or just one or the other, please advise patient on this.

## 2023-09-25 ENCOUNTER — Other Ambulatory Visit: Payer: Self-pay | Admitting: Emergency Medicine

## 2023-09-25 MED ORDER — TAMSULOSIN HCL 0.4 MG PO CAPS: 0.4000 mg | ORAL_CAPSULE | Freq: Every day | ORAL | 3 refills | Status: AC

## 2023-09-25 NOTE — Telephone Encounter (Signed)
 He is supposed to take both metoprolol and amlodipine.  New prescription for Flomax sent to pharmacy of record today.  Thanks.

## 2023-09-25 NOTE — Telephone Encounter (Signed)
 LVM for Patient to call back for his previous questions regarding meds

## 2023-10-01 NOTE — Telephone Encounter (Signed)
 Copied from CRM [id number]. Topic: Clinical - Medication Question >> Oct 01, 2023  3:47 PM Russell PARAS wrote: Reason for CRM: Patient was recently prescribed Flomax , and has concerns with the side effect of fainting mentioned in the medication information. He is worried that he will have a fainting episode while driving to work in the mornings after taking the Flomax  at night. He would like more information concerning this side effect, possible ways to take the medication and prevent fainting, or discuss another medication he could try. CB# [phone]

## 2023-10-02 NOTE — Telephone Encounter (Signed)
 He can take the medication anytime of the day.  Start taking it at home after work.  Fainting is due to drops in blood pressure.  Very rare side effect.  Safe medication.  Thanks.

## 2023-10-03 NOTE — Telephone Encounter (Signed)
Spoke with patient and informed him. States someone else in the office had already updated him. He had no other questions

## 2023-10-29 ENCOUNTER — Other Ambulatory Visit: Payer: Self-pay

## 2023-10-30 ENCOUNTER — Encounter: Payer: Self-pay | Admitting: Cardiology

## 2023-10-30 ENCOUNTER — Ambulatory Visit: Payer: Medicare HMO | Attending: Cardiology | Admitting: Cardiology

## 2023-10-30 VITALS — BP 130/68 | HR 61 | Ht 71.0 in | Wt 154.0 lb

## 2023-10-30 DIAGNOSIS — Z8673 Personal history of transient ischemic attack (TIA), and cerebral infarction without residual deficits: Secondary | ICD-10-CM

## 2023-10-30 DIAGNOSIS — I10 Essential (primary) hypertension: Secondary | ICD-10-CM | POA: Diagnosis not present

## 2023-10-30 DIAGNOSIS — R931 Abnormal findings on diagnostic imaging of heart and coronary circulation: Secondary | ICD-10-CM | POA: Diagnosis not present

## 2023-10-30 DIAGNOSIS — F17200 Nicotine dependence, unspecified, uncomplicated: Secondary | ICD-10-CM | POA: Diagnosis not present

## 2023-10-30 DIAGNOSIS — E782 Mixed hyperlipidemia: Secondary | ICD-10-CM | POA: Diagnosis not present

## 2023-10-30 NOTE — Patient Instructions (Signed)
Medication Instructions:  Your physician recommends that you continue on your current medications as directed. Please refer to the Current Medication list given to you today.  *If you need a refill on your cardiac medications before your next appointment, please call your pharmacy*   Lab Work: None Ordered If you have labs (blood work) drawn today and your tests are completely normal, you will receive your results only by: MyChart Message (if you have MyChart) OR A paper copy in the mail If you have any lab test that is abnormal or we need to change your treatment, we will call you to review the results.   Testing/Procedures: None Ordered   Follow-Up: At [facility], you and your health needs are our priority.  As part of our continuing mission to provide you with exceptional heart care, we have created designated Provider Care Teams.  These Care Teams include your primary Cardiologist (physician) and Advanced Practice Providers (APPs -  Physician Assistants and Nurse Practitioners) who all work together to provide you with the care you need, when you need it.  We recommend signing up for the patient portal called "MyChart".  Sign up information is provided on this After Visit Summary.  MyChart is used to connect with patients for Virtual Visits (Telemedicine).  Patients are able to view lab/test results, encounter notes, upcoming appointments, etc.  Non-urgent messages can be sent to your provider as well.   To learn more about what you can do with MyChart, go to [email or web address].    Your next appointment:   12 month follow up

## 2023-10-30 NOTE — Progress Notes (Signed)
Cardiology Office Note:    Date:  10/30/2023   ID:  Justin Odom., DOB 01/08/1956, MRN [id number]  PCP:  Georgina Quint, MD  Cardiologist:  Garwin Brothers, MD   Referring MD: Georgina Quint, *    ASSESSMENT:    1. Mixed hyperlipidemia   2. Elevated coronary artery calcium score   3. Essential hypertension   4. H/O TIA (transient ischemic attack) and stroke   5. Current smoker    PLAN:    In order of problems listed above:  Elevated calcium score: Secondary prevention stressed to the patient.  Importance of compliance with diet medication stressed any vocalized understanding.  He was advised to walk at least half an hour a day on a daily basis and he promises to do so. Essential hypertension: Blood pressure stable and diet was emphasized. Mixed dyslipidemia: On lipid-lowering medications followed by primary care.  Lipids are not at goal and he is aware of this.  Diet emphasized.  Goal LDL less than 70. Cigarette smoker: I spent 5 minutes with the patient discussing solely about smoking. Smoking cessation was counseled. I suggested to the patient also different medications and pharmacological interventions. Patient is keen to try stopping on its own at this time. He will get back to me if he needs any further assistance in this matter. Patient will be seen in follow-up appointment in 12 months or earlier if the patient has any concerns.    Medication Adjustments/Labs and Tests Ordered: Current medicines are reviewed at length with the patient today.  Concerns regarding medicines are outlined above.  Orders Placed This Encounter  Procedures   EKG 12-Lead   No orders of the defined types were placed in this encounter.    No chief complaint on file.    History of Present Illness:    Justin Odom. is a 68 y.o. male.  Patient has past medical history of essential hypertension, mixed dyslipidemia, TIA, elevated calcium score unfortunately continues to  smoke.  He leads a sedentary lifestyle.  No chest pain orthopnea or PND.  At the time of my evaluation, the patient is alert awake oriented and in no distress.  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/04/2020   Essential hypertension 02/02/2019   H/O TIA (transient ischemic attack) and stroke 11/29/2014   Hyperlipidemia    Lower urinary tract symptoms 08/27/2022   Premature heartbeats 09/28/2019    Past Surgical History:  Procedure Laterality Date   NO PAST SURGERIES      Current Medications: Current Meds  Medication Sig   amLODipine (NORVASC) 5 MG tablet Take 1 tablet (5 mg total) by mouth daily.   aspirin EC 81 MG tablet Take 81 mg by mouth daily.   atorvastatin (LIPITOR) 40 MG tablet TAKE 1 TABLET BY MOUTH EVERY DAY   cholecalciferol (VITAMIN D3) 25 MCG (1000 UNIT) tablet Take 1,000 Units by mouth daily.   Coenzyme Q10 (CO Q-10 PO) Take 1 capsule by mouth daily.   metoprolol succinate (TOPROL-XL) 50 MG 24 hr tablet Take 50 mg by mouth daily.   tamsulosin (FLOMAX) 0.4 MG CAPS capsule Take 1 capsule (0.4 mg total) by mouth at bedtime.  Allergies:   Patient has no known allergies.   Social History   Socioeconomic History   Marital status: Divorced    Spouse name: Not on file   Number of children: Not on file   Years of education: Not on file   Highest education level: Not on file  Occupational History   Not on file  Tobacco Use   Smoking status: Some Days    Current packs/day: 0.00    Types: Cigarettes    Last attempt to quit: 09/24/2019    Years since quitting: 4.1   Smokeless tobacco: Never   Tobacco comments:    3 cigarettes a day  Vaping Use    Vaping status: Never Used  Substance and Sexual Activity   Alcohol use: Yes    Alcohol/week: 0.0 standard drinks of alcohol    Comment: occ   Drug use: Yes    Types: Marijuana   Sexual activity: Not on file  Other Topics Concern   Not on file  Social History Narrative   Not on file   Social Drivers of Health   Financial Resource Strain: Not on file  Food Insecurity: Not on file  Transportation Needs: Not on file  Physical Activity: Not on file  Stress: Not on file  Social Connections: Not on file     Family History: The patient's family history includes Cancer in his mother and sister; Pancreatic cancer in his mother. There is no history of Colon cancer.  ROS:   Please see the history of present illness.    All other systems reviewed and are negative.  EKGs/Labs/Other Studies Reviewed:    The following studies were reviewed today: I discussed my findings with the patient at length. ..EKG Interpretation Date/Time:  Wednesday October 30 2023 08:54:38 EST Ventricular Rate:  61 PR Interval:  128 QRS Duration:  82 QT Interval:  422 QTC Calculation: 424 R Axis:   87  Text Interpretation: Normal sinus rhythm Anteroseptal infarct (cited on or before 01-Aug-2011) T wave abnormality, consider inferior ischemia When compared with ECG of 01-Aug-2011 15:05, Questionable change in initial forces of Anteroseptal leads ST now depressed in Inferior leads Confirmed by Belva Crome [phone]) on 10/30/2023 9:04:16 AM     Recent Labs: 09/23/2023: ALT 5; BUN 15; Creatinine, Ser 0.95; Hemoglobin 13.2; Platelets 276.0; Potassium 4.5; Sodium 138  Recent Lipid Panel    Component Value Date/Time   CHOL 158 09/23/2023 1504   CHOL 126 08/07/2021 0904   TRIG 41.0 09/23/2023 1504   HDL 47.90 09/23/2023 1504   HDL 38 (L) 08/07/2021 0904   CHOLHDL 3 09/23/2023 1504   VLDL 8.2 09/23/2023 1504   LDLCALC 102 (H) 09/23/2023 1504   LDLCALC 77 08/07/2021 0904    Physical Exam:    VS:  BP  130/68   Pulse 61   Ht 5\' 11"  (1.803 m)   Wt 154 lb (69.9 kg)   SpO2 97%   BMI 21.48 kg/m     Wt Readings from Last 3 Encounters:  10/30/23 154 lb (69.9 kg)  09/23/23 155 lb (70.3 kg)  10/09/22 155 lb 1.9 oz (70.4 kg)     GEN: Patient is in no acute distress HEENT: Normal NECK: No JVD; No carotid bruits LYMPHATICS: No lymphadenopathy CARDIAC: Hear sounds regular, 2/6 systolic murmur at the apex. RESPIRATORY:  Clear to auscultation without rales, wheezing or rhonchi  ABDOMEN: Soft, non-tender, non-distended MUSCULOSKELETAL:  No edema; No deformity  SKIN: Warm and dry NEUROLOGIC:  Alert and oriented  x 3 PSYCHIATRIC:  Normal affect   Signed, Garwin Brothers, MD  10/30/2023 9:22 AM    [facility]

## 2023-12-02 NOTE — Progress Notes (Signed)
 This encounter was created in error - please disregard.  Patient informed me that he was not feeling good and could not do the visit today.  Patient states that he will call office back to reschedule visit.

## 2023-12-03 ENCOUNTER — Ambulatory Visit
Admission: EM | Admit: 2023-12-03 | Discharge: 2023-12-03 | Disposition: A | Discharge status: Home / Self Care | Attending: Internal Medicine | Admitting: Internal Medicine

## 2023-12-03 ENCOUNTER — Encounter: Payer: Self-pay | Admitting: Emergency Medicine

## 2023-12-03 DIAGNOSIS — J101 Influenza due to other identified influenza virus with other respiratory manifestations: Secondary | ICD-10-CM

## 2023-12-03 LAB — POC COVID19/FLU A&B COMBO
Covid Antigen, POC: NEGATIVE
Influenza A Antigen, POC: POSITIVE — AB
Influenza B Antigen, POC: NEGATIVE

## 2023-12-03 MED ORDER — OSELTAMIVIR PHOSPHATE 75 MG PO CAPS: 75.0000 mg | ORAL_CAPSULE | Freq: Two times a day (BID) | ORAL | 0 refills | Status: DC

## 2023-12-03 NOTE — ED Provider Notes (Signed)
 [facility]    CSN: [id number] Arrival date & time: 12/03/23  4098      History   Chief Complaint Chief Complaint  Patient presents with   Influenza    HPI Justin Odom. is a 68 y.o. male.   Patient presents with sneezing, nasal congestion, diarrhea that started about 3 days ago.  Denies cough or fever.  Denies any obvious known sick contacts.  Denies history of asthma or COPD.   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 Authorizing Provider  oseltamivir (TAMIFLU) 75 MG capsule Take 1 capsule (75 mg total) by mouth every 12 (twelve) hours. 12/03/23  Yes Eh Sesay, Rolly Salter E, FNP  amLODipine (NORVASC) 5 MG tablet Take 1 tablet (5 mg total) by mouth daily. 09/23/23   Georgina Quint, MD  aspirin EC 81 MG tablet Take 81 mg by mouth daily.    [provider]  atorvastatin (LIPITOR) 40 MG tablet TAKE 1 TABLET BY MOUTH EVERY DAY 03/31/23   Georgina Quint, MD  cholecalciferol (VITAMIN D3) 25 MCG (1000 UNIT) tablet Take 1,000 Units by mouth daily.    [provider]  Coenzyme Q10 (CO Q-10 PO) Take 1 capsule by mouth daily.    [provider]  metoprolol succinate (TOPROL-XL) 50 MG 24 hr tablet Take 50 mg by mouth daily.    [provider]  tamsulosin (FLOMAX) 0.4 MG CAPS capsule Take 1 capsule (0.4 mg total) by mouth at bedtime. 09/25/23   Georgina Quint, MD    Family History Family History  Problem Relation Age of Onset   Cancer Mother    Pancreatic cancer Mother    Cancer Sister    Colon cancer Neg Hx     Social History Social History   Tobacco Use   Smoking status: Some Days    Current packs/day: 0.00    Types: Cigarettes    Last attempt to quit: 09/24/2019    Years since quitting: 4.1   Smokeless tobacco: Never   Tobacco comments:    3 cigarettes a day  Vaping Use   Vaping status: Never Used  Substance Use Topics   Alcohol use: Yes    Alcohol/week: 0.0 standard drinks of alcohol    Comment: occ   Drug use: Yes  Types: Marijuana     Allergies   Patient has no known allergies.   Review of Systems Review of Systems Per HPI  Physical Exam Triage Vital Signs ED Triage Vitals  Encounter Vitals Group     BP 12/03/23 0908 (!) 147/81     Systolic BP Percentile --      Diastolic BP Percentile --      Pulse Rate 12/03/23 0908 68     Resp 12/03/23 0908 18     Temp 12/03/23 0908 98.1 F (36.7 C)     Temp Source 12/03/23 0908 Oral     SpO2 12/03/23 0908 96 %     Weight 12/03/23 0906 154 lb 1.6 oz (69.9 kg)     Height 12/03/23 0906 5\' 11"  (1.803 m)     Head Circumference --      Peak Flow --      Pain Score 12/03/23 0905 0     Pain Loc --      Pain Education --      Exclude from Growth  Chart --    No data found.  Updated Vital Signs BP (!) 147/81 (BP Location: Right Arm)   Pulse 68   Temp 98.1 F (36.7 C) (Oral)   Resp 18   Ht 5\' 11"  (1.803 m)   Wt 154 lb 1.6 oz (69.9 kg)   SpO2 96%   BMI 21.49 kg/m   Visual Acuity Right Eye Distance:   Left Eye Distance:   Bilateral Distance:    Right Eye Near:   Left Eye Near:    Bilateral Near:     Physical Exam Constitutional:      General: He is not in acute distress.    Appearance: Normal appearance. He is not toxic-appearing or diaphoretic.  HENT:     Head: Normocephalic and atraumatic.     Right Ear: Tympanic membrane and ear canal normal.     Left Ear: Tympanic membrane and ear canal normal.     Nose: Congestion present.     Mouth/Throat:     Mouth: Mucous membranes are moist.     Pharynx: No posterior oropharyngeal erythema.  Eyes:     Extraocular Movements: Extraocular movements intact.     Conjunctiva/sclera: Conjunctivae normal.     Pupils: Pupils are equal, round, and reactive to light.  Cardiovascular:     Rate and Rhythm: Normal rate and regular rhythm.     Pulses: Normal pulses.     Heart sounds: Normal heart sounds.  Pulmonary:     Effort: Pulmonary effort is normal. No respiratory distress.     Breath sounds: Normal breath sounds. No stridor. No wheezing, rhonchi or rales.  Abdominal:     General: Abdomen is flat. Bowel sounds are normal.     Palpations: Abdomen is soft.  Musculoskeletal:        General: Normal range of motion.     Cervical back: Normal range of motion.  Skin:    General: Skin is warm and dry.  Neurological:     General: No focal deficit present.     Mental Status: He is alert and oriented to person, place, and time. Mental status is at baseline.  Psychiatric:        Mood and Affect: Mood normal.        Behavior: Behavior normal.      UC Treatments / Results  Labs (all labs ordered are listed, but only abnormal results are displayed) Labs Reviewed  POC  COVID19/FLU A&B COMBO - Abnormal; Notable for the following components:      Result Value   Influenza A Antigen, POC Positive (*)    All other components within normal limits    EKG   Radiology No results found.  Procedures Procedures (including critical care time)  Medications Ordered in UC Medications - No data to display  Initial Impression / Assessment and Plan / UC Course  I have reviewed the triage vital signs and the nursing notes.  Pertinent labs & imaging results that were available during my care of the patient were reviewed by me and considered in my medical decision making (see chart for details).     Rapid flu was positive for influenza A.  Will treat with Tamiflu given patient is right inside treatment window.  Advised fluids, rest, symptom management as well.  Advised strict follow-up precautions.  Patient verbalized understanding and was agreeable with plan. Final Clinical Impressions(s) / UC Diagnoses   Final diagnoses:  Influenza A     Discharge Instructions      You have the flu so I have prescribed Tamiflu for you to start taking today.  Ensure adequate fluids and rest.    ED Prescriptions     Medication Sig Dispense Auth. Provider   oseltamivir (TAMIFLU) 75 MG capsule Take 1 capsule (75 mg total) by mouth every 12 (twelve) hours. 10 capsule Gustavus Bryant, [place]      PDMP not reviewed this encounter.   Gustavus Bryant, [place] 12/03/23 1048

## 2023-12-03 NOTE — Discharge Instructions (Signed)
 You have the flu so I have prescribed Tamiflu for you to start taking today.  Ensure adequate fluids and rest.

## 2023-12-03 NOTE — ED Triage Notes (Signed)
 Pt presents with flu like symptoms x3 days. Pt denies emesis and fever.

## 2023-12-26 DIAGNOSIS — N481 Balanitis: Secondary | ICD-10-CM | POA: Diagnosis not present

## 2023-12-26 DIAGNOSIS — R3121 Asymptomatic microscopic hematuria: Secondary | ICD-10-CM | POA: Diagnosis not present

## 2023-12-26 DIAGNOSIS — R972 Elevated prostate specific antigen [PSA]: Secondary | ICD-10-CM | POA: Diagnosis not present

## 2023-12-26 DIAGNOSIS — N402 Nodular prostate without lower urinary tract symptoms: Secondary | ICD-10-CM | POA: Diagnosis not present

## 2023-12-30 ENCOUNTER — Encounter: Payer: Self-pay | Admitting: Urology

## 2023-12-30 ENCOUNTER — Other Ambulatory Visit: Payer: Self-pay | Admitting: Urology

## 2023-12-30 DIAGNOSIS — N402 Nodular prostate without lower urinary tract symptoms: Secondary | ICD-10-CM

## 2023-12-30 DIAGNOSIS — R3121 Asymptomatic microscopic hematuria: Secondary | ICD-10-CM

## 2023-12-30 DIAGNOSIS — R972 Elevated prostate specific antigen [PSA]: Secondary | ICD-10-CM

## 2023-12-31 ENCOUNTER — Encounter: Payer: Self-pay | Admitting: Urology

## 2024-01-20 ENCOUNTER — Encounter

## 2024-01-20 NOTE — Progress Notes (Signed)
 This encounter was created in error - please disregard. Called x3 w/ no answer.  I LDM for patient to call office and reschedule.

## 2024-03-03 ENCOUNTER — Other Ambulatory Visit: Payer: Self-pay | Admitting: Emergency Medicine

## 2024-03-23 ENCOUNTER — Ambulatory Visit (INDEPENDENT_AMBULATORY_CARE_PROVIDER_SITE_OTHER): Payer: Medicare HMO | Admitting: Emergency Medicine

## 2024-03-23 ENCOUNTER — Other Ambulatory Visit: Payer: Self-pay | Admitting: Emergency Medicine

## 2024-03-23 ENCOUNTER — Encounter: Payer: Self-pay | Admitting: Emergency Medicine

## 2024-03-23 VITALS — BP 138/76 | HR 67 | Temp 98.6°F | Ht 71.0 in | Wt 152.0 lb

## 2024-03-23 DIAGNOSIS — E785 Hyperlipidemia, unspecified: Secondary | ICD-10-CM | POA: Diagnosis not present

## 2024-03-23 DIAGNOSIS — I10 Essential (primary) hypertension: Secondary | ICD-10-CM | POA: Diagnosis not present

## 2024-03-23 DIAGNOSIS — Z8673 Personal history of transient ischemic attack (TIA), and cerebral infarction without residual deficits: Secondary | ICD-10-CM

## 2024-03-23 DIAGNOSIS — F17200 Nicotine dependence, unspecified, uncomplicated: Secondary | ICD-10-CM

## 2024-03-23 MED ORDER — NICOTINE 10 MG IN INHA: 1.0000 | RESPIRATORY_TRACT | 0 refills | Status: AC | PRN

## 2024-03-23 NOTE — Assessment & Plan Note (Signed)
 Secondary prevention discussed.  Continue baby aspirin  daily. Continue atorvastatin  40 mg daily.  Encouraged to stop smoking. Dangers of uncontrolled hypertension discussed Recommend to continue metoprolol  succinate 50 mg daily and amlodipine  5 mg daily

## 2024-03-23 NOTE — Patient Instructions (Signed)
 Hypertension, Adult High blood pressure (hypertension) is when the force of blood pumping through the arteries is too strong. The arteries are the blood vessels that carry blood from the heart throughout the body. Hypertension forces the heart to work harder to pump blood and may cause arteries to become narrow or stiff. Untreated or uncontrolled hypertension can lead to a heart attack, heart failure, a stroke, kidney disease, and other problems. A blood pressure reading consists of a higher number over a lower number. Ideally, your blood pressure should be below 120/80. The first ("top") number is called the systolic pressure. It is a measure of the pressure in your arteries as your heart beats. The second ("bottom") number is called the diastolic pressure. It is a measure of the pressure in your arteries as the heart relaxes. What are the causes? The exact cause of this condition is not known. There are some conditions that result in high blood pressure. What increases the risk? Certain factors may make you more likely to develop high blood pressure. Some of these risk factors are under your control, including: Smoking. Not getting enough exercise or physical activity. Being overweight. Having too much fat, sugar, calories, or salt (sodium) in your diet. Drinking too much alcohol. Other risk factors include: Having a personal history of heart disease, diabetes, high cholesterol, or kidney disease. Stress. Having a family history of high blood pressure and high cholesterol. Having obstructive sleep apnea. Age. The risk increases with age. What are the signs or symptoms? High blood pressure may not cause symptoms. Very high blood pressure (hypertensive crisis) may cause: Headache. Fast or irregular heartbeats (palpitations). Shortness of breath. Nosebleed. Nausea and vomiting. Vision changes. Severe chest pain, dizziness, and seizures. How is this diagnosed? This condition is diagnosed by  measuring your blood pressure while you are seated, with your arm resting on a flat surface, your legs uncrossed, and your feet flat on the floor. The cuff of the blood pressure monitor will be placed directly against the skin of your upper arm at the level of your heart. Blood pressure should be measured at least twice using the same arm. Certain conditions can cause a difference in blood pressure between your right and left arms. If you have a high blood pressure reading during one visit or you have normal blood pressure with other risk factors, you may be asked to: Return on a different day to have your blood pressure checked again. Monitor your blood pressure at home for 1 week or longer. If you are diagnosed with hypertension, you may have other blood or imaging tests to help your health care provider understand your overall risk for other conditions. How is this treated? This condition is treated by making healthy lifestyle changes, such as eating healthy foods, exercising more, and reducing your alcohol intake. You may be referred for counseling on a healthy diet and physical activity. Your health care provider may prescribe medicine if lifestyle changes are not enough to get your blood pressure under control and if: Your systolic blood pressure is above 130. Your diastolic blood pressure is above 80. Your personal target blood pressure may vary depending on your medical conditions, your age, and other factors. Follow these instructions at home: Eating and drinking  Eat a diet that is high in fiber and potassium, and low in sodium, added sugar, and fat. An example of this eating plan is called the DASH diet. DASH stands for Dietary Approaches to Stop Hypertension. To eat this way: Eat  plenty of fresh fruits and vegetables. Try to fill one half of your plate at each meal with fruits and vegetables. Eat whole grains, such as whole-wheat pasta, brown rice, or whole-grain bread. Fill about one  fourth of your plate with whole grains. Eat or drink low-fat dairy products, such as skim milk or low-fat yogurt. Avoid fatty cuts of meat, processed or cured meats, and poultry with skin. Fill about one fourth of your plate with lean proteins, such as fish, chicken without skin, beans, eggs, or tofu. Avoid pre-made and processed foods. These tend to be higher in sodium, added sugar, and fat. Reduce your daily sodium intake. Many people with hypertension should eat less than 1,500 mg of sodium a day. Do not drink alcohol if: Your health care provider tells you not to drink. You are pregnant, may be pregnant, or are planning to become pregnant. If you drink alcohol: Limit how much you have to: 0-1 drink a day for women. 0-2 drinks a day for men. Know how much alcohol is in your drink. In the [place] one drink equals one 12 oz bottle of beer (355 mL), one 5 oz glass of wine (148 mL), or one 1 oz glass of hard liquor (44 mL). Lifestyle  Work with your health care provider to maintain a healthy body weight or to lose weight. Ask what an ideal weight is for you. Get at least 30 minutes of exercise that causes your heart to beat faster (aerobic exercise) most days of the week. Activities may include walking, swimming, or biking. Include exercise to strengthen your muscles (resistance exercise), such as Pilates or lifting weights, as part of your weekly exercise routine. Try to do these types of exercises for 30 minutes at least 3 days a week. Do not use any products that contain nicotine or tobacco. These products include cigarettes, chewing tobacco, and vaping devices, such as e-cigarettes. If you need help quitting, ask your health care provider. Monitor your blood pressure at home as told by your health care provider. Keep all follow-up visits. This is important. Medicines Take over-the-counter and prescription medicines only as told by your health care provider. Follow directions carefully. Blood  pressure medicines must be taken as prescribed. Do not skip doses of blood pressure medicine. Doing this puts you at risk for problems and can make the medicine less effective. Ask your health care provider about side effects or reactions to medicines that you should watch for. Contact a health care provider if you: Think you are having a reaction to a medicine you are taking. Have headaches that keep coming back (recurring). Feel dizzy. Have swelling in your ankles. Have trouble with your vision. Get help right away if you: Develop a severe headache or confusion. Have unusual weakness or numbness. Feel faint. Have severe pain in your chest or abdomen. Vomit repeatedly. Have trouble breathing. These symptoms may be an emergency. Get help right away. Call 911. Do not wait to see if the symptoms will go away. Do not drive yourself to the hospital. Summary Hypertension is when the force of blood pumping through your arteries is too strong. If this condition is not controlled, it may put you at risk for serious complications. Your personal target blood pressure may vary depending on your medical conditions, your age, and other factors. For most people, a normal blood pressure is less than 120/80. Hypertension is treated with lifestyle changes, medicines, or a combination of both. Lifestyle changes include losing weight, eating a healthy,  low-sodium diet, exercising more, and limiting alcohol. This information is not intended to replace advice given to you by your health care provider. Make sure you discuss any questions you have with your health care provider. Document Revised: 07/11/2021 Document Reviewed: 07/11/2021 Elsevier Patient Education  2024 [employer].

## 2024-03-23 NOTE — Assessment & Plan Note (Signed)
 Still smoking about 6 cigarettes a day. Wants to start nicotine  inhaler Cardiovascular and cancer risks associated with smoking discussed Smoking cessation advice given

## 2024-03-23 NOTE — Assessment & Plan Note (Signed)
Stable.  Diet and nutrition discussed. Continue atorvastatin 40 mg daily. 

## 2024-03-23 NOTE — Assessment & Plan Note (Signed)
 BP Readings from Last 3 Encounters:  03/23/24 138/76  12/03/23 (!) 147/81  10/30/23 130/68  Well-controlled hypertension Cardiovascular risks associated with hypertension discussed Dietary approaches to stop hypertension discussed Benefits of exercise discussed Recommend to continue metoprolol  succinate 50 mg daily and amlodipine  5 mg daily Unremarkable blood work 6 months ago Will follow-up in 6 months

## 2024-03-23 NOTE — Progress Notes (Signed)
 Justin Odom. 68 y.o.   Chief Complaint  Patient presents with   Follow-up    6 month f/u for HTN. No other concerns     HISTORY OF PRESENT ILLNESS: This is a 68 y.o. male A1A here for 73-month follow-up of hypertension Overall doing well. Inquiring about nicotine  inhaler.  Smokes about 6 cigarettes a day.  Wants to stop smoking. No other complaints or medical concerns today.  HPI   Prior to Admission medications   Medication Sig Start Date End Date Taking? Authorizing Provider  amLODipine  (NORVASC ) 5 MG tablet Take 1 tablet (5 mg total) by mouth daily. 09/23/23  Yes SagardiaEmil Schanz, MD  aspirin  EC 81 MG tablet Take 81 mg by mouth daily.   Yes [provider]  atorvastatin  (LIPITOR) 40 MG tablet TAKE 1 TABLET BY MOUTH EVERY DAY 03/31/23  Yes Francis Doenges, Emil Schanz, MD  cholecalciferol (VITAMIN D3) 25 MCG (1000 UNIT) tablet Take 1,000 Units by mouth daily.   Yes [provider]  Coenzyme Q10 (CO Q-10 PO) Take 1 capsule by mouth daily.   Yes [provider]  metoprolol  succinate (TOPROL -XL) 50 MG 24 hr tablet TAKE 1 TABLET DAILY. MUST KEEP 10/09/2022 APPT FOR ADDITIONAL REFILLS 03/03/24  Yes Charee Tumblin, Emil Schanz, MD  nicotine  (NICOTROL ) 10 MG inhaler Inhale 1 Cartridge (1 continuous puffing total) into the lungs as needed for smoking cessation. 03/23/24  Yes Elanah Osmanovic, Emil Schanz, MD  tamsulosin  (FLOMAX ) 0.4 MG CAPS capsule Take 1 capsule (0.4 mg total) by mouth at bedtime. 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   Dyslipidemia 05/02/2015   Elevated coronary artery calcium  score 07/04/2020   Essential hypertension 02/02/2019   H/O TIA (transient ischemic attack) and stroke 11/29/2014   Hyperlipidemia    Lower urinary tract symptoms 08/27/2022   Premature heartbeats 09/28/2019    Past Surgical History:  Procedure Laterality Date   NO PAST SURGERIES      Social History   Socioeconomic History   Marital status: Divorced    Spouse name: Not on file   Number of children: Not on file   Years of education: Not on file   Highest education level: Not on file  Occupational History   Not on file  Tobacco Use   Smoking status: Some Days    Current packs/day: 0.00    Types: Cigarettes    Last attempt to quit: 09/24/2019    Years since quitting: 4.4   Smokeless tobacco: Never   Tobacco comments:    3 cigarettes a day  Vaping Use   Vaping status: Never Used  Substance and Sexual Activity   Alcohol use: Yes    Alcohol/week:  0.0 standard drinks of alcohol    Comment: occ   Drug use: Yes    Types: Marijuana   Sexual activity: Not on file  Other Topics Concern   Not on file  Social History Narrative   Not on file   Social Drivers of Health   Financial Resource Strain: Not on file  Food Insecurity: Not on file  Transportation Needs: Not on file  Physical Activity: Not on file  Stress: Not on file  Social Connections: Not on file  Intimate Partner Violence: Not on file    Family History  Problem Relation Age of Onset   Cancer Mother    Pancreatic cancer Mother    Cancer Sister    Colon  cancer Neg Hx      Review of Systems  Constitutional: Negative.  Negative for chills and fever.  HENT: Negative.  Negative for congestion and sore throat.   Respiratory: Negative.  Negative for cough and shortness of breath.   Cardiovascular: Negative.  Negative for chest pain and palpitations.  Gastrointestinal:  Negative for abdominal pain, diarrhea, nausea and vomiting.  Genitourinary: Negative.  Negative for dysuria and hematuria.  Skin: Negative.  Negative for rash.  Neurological: Negative.  Negative for dizziness and headaches.  All other systems reviewed and are negative.   Vitals:   03/23/24 0809  BP: 138/76  Pulse: 67  Temp: 98.6 F (37 C)  SpO2: 96%    Physical Exam Vitals reviewed.  Constitutional:      Appearance: Normal appearance.  HENT:     Head: Normocephalic.  Eyes:     Extraocular Movements: Extraocular movements intact.  Cardiovascular:     Rate and Rhythm: Normal rate and regular rhythm.     Pulses: Normal pulses.     Heart sounds: Normal heart sounds.  Pulmonary:     Effort: Pulmonary effort is normal.     Breath sounds: Normal breath sounds.  Musculoskeletal:     Cervical back: No tenderness.  Lymphadenopathy:     Cervical: No cervical adenopathy.  Skin:    General: Skin is warm and dry.     Capillary Refill: Capillary refill takes less than 2 seconds.  Neurological:     General: No focal deficit present.     Mental Status: He is alert and oriented to person, place, and time.  Psychiatric:        Mood and Affect: Mood normal.        Behavior: Behavior normal.      ASSESSMENT & PLAN: A total of 45 minutes was spent with the patient and counseling/coordination of care regarding preparing for this visit, review of most recent office visit notes, review of multiple chronic medical conditions and their management, cardiovascular risks associated with uncontrolled hypertension, smoking cessation advice, review of all medications, review of  most recent bloodwork results, review of health maintenance items, education on nutrition, prognosis, documentation, and need for follow up.   Problem List Items Addressed This Visit       Cardiovascular and Mediastinum   Essential hypertension - Primary   BP Readings from Last 3 Encounters:  03/23/24 138/76  12/03/23 (!) 147/81  10/30/23 130/68  Well-controlled hypertension Cardiovascular risks associated with hypertension discussed Dietary approaches to stop hypertension discussed Benefits of exercise discussed Recommend to continue metoprolol  succinate 50 mg daily and amlodipine  5 mg daily Unremarkable blood work 6 months ago Will follow-up in 6 months         Other   H/O  TIA (transient ischemic attack) and stroke   Secondary prevention discussed.  Continue baby aspirin  daily. Continue atorvastatin  40 mg daily.  Encouraged to stop smoking. Dangers of uncontrolled hypertension discussed Recommend to continue metoprolol  succinate 50 mg daily and amlodipine  5 mg daily      Dyslipidemia   Stable.  Diet and nutrition discussed. Continue atorvastatin  40 mg daily.       Current smoker   Still smoking about 6 cigarettes a day. Wants to start nicotine  inhaler Cardiovascular and cancer risks associated with smoking discussed Smoking cessation advice given      Relevant Medications   nicotine  (NICOTROL ) 10 MG inhaler    Patient Instructions  Hypertension, Adult High blood pressure (hypertension) is when the force of blood pumping through the arteries is too strong. The arteries are the blood vessels that carry blood from the heart throughout the body. Hypertension forces the heart to work harder to pump blood and may cause arteries to become narrow or stiff. Untreated or uncontrolled hypertension can lead to a heart attack, heart failure, a stroke, kidney disease, and other problems. A blood pressure reading consists of a higher number over a lower number. Ideally, your  blood pressure should be below 120/80. The first (top) number is called the systolic pressure. It is a measure of the pressure in your arteries as your heart beats. The second (bottom) number is called the diastolic pressure. It is a measure of the pressure in your arteries as the heart relaxes. What are the causes? The exact cause of this condition is not known. There are some conditions that result in high blood pressure. What increases the risk? Certain factors may make you more likely to develop high blood pressure. Some of these risk factors are under your control, including: Smoking. Not getting enough exercise or physical activity. Being overweight. Having too much fat, sugar, calories, or salt (sodium) in your diet. Drinking too much alcohol. Other risk factors include: Having a personal history of heart disease, diabetes, high cholesterol, or kidney disease. Stress. Having a family history of high blood pressure and high cholesterol. Having obstructive sleep apnea. Age. The risk increases with age. What are the signs or symptoms? High blood pressure may not cause symptoms. Very high blood pressure (hypertensive crisis) may cause: Headache. Fast or irregular heartbeats (palpitations). Shortness of breath. Nosebleed. Nausea and vomiting. Vision changes. Severe chest pain, dizziness, and seizures. How is this diagnosed? This condition is diagnosed by measuring your blood pressure while you are seated, with your arm resting on a flat surface, your legs uncrossed, and your feet flat on the floor. The cuff of the blood pressure monitor will be placed directly against the skin of your upper arm at the level of your heart. Blood pressure should be measured at least twice using the same arm. Certain conditions can cause a difference in blood pressure between your right and left arms. If you have a high blood pressure reading during one visit or you have normal blood pressure with other  risk factors, you may be asked to: Return on a different day to have your blood pressure checked again. Monitor your blood pressure at home for 1 week or longer. If you are diagnosed with hypertension, you may have other blood or imaging tests to help your health care provider understand your overall risk for other conditions. How is this treated? This condition is treated by making healthy lifestyle changes, such as eating healthy foods, exercising more, and reducing your alcohol  intake. You may be referred for counseling on a healthy diet and physical activity. Your health care provider may prescribe medicine if lifestyle changes are not enough to get your blood pressure under control and if: Your systolic blood pressure is above 130. Your diastolic blood pressure is above 80. Your personal target blood pressure may vary depending on your medical conditions, your age, and other factors. Follow these instructions at home: Eating and drinking  Eat a diet that is high in fiber and potassium, and low in sodium, added sugar, and fat. An example of this eating plan is called the DASH diet. DASH stands for Dietary Approaches to Stop Hypertension. To eat this way: Eat plenty of fresh fruits and vegetables. Try to fill one half of your plate at each meal with fruits and vegetables. Eat whole grains, such as whole-wheat pasta, brown rice, or whole-grain bread. Fill about one fourth of your plate with whole grains. Eat or drink low-fat dairy products, such as skim milk or low-fat yogurt. Avoid fatty cuts of meat, processed or cured meats, and poultry with skin. Fill about one fourth of your plate with lean proteins, such as fish, chicken without skin, beans, eggs, or tofu. Avoid pre-made and processed foods. These tend to be higher in sodium, added sugar, and fat. Reduce your daily sodium intake. Many people with hypertension should eat less than 1,500 mg of sodium a day. Do not drink alcohol if: Your  health care provider tells you not to drink. You are pregnant, may be pregnant, or are planning to become pregnant. If you drink alcohol: Limit how much you have to: 0-1 drink a day for women. 0-2 drinks a day for men. Know how much alcohol is in your drink. In the [place] one drink equals one 12 oz bottle of beer (355 mL), one 5 oz glass of wine (148 mL), or one 1 oz glass of hard liquor (44 mL). Lifestyle  Work with your health care provider to maintain a healthy body weight or to lose weight. Ask what an ideal weight is for you. Get at least 30 minutes of exercise that causes your heart to beat faster (aerobic exercise) most days of the week. Activities may include walking, swimming, or biking. Include exercise to strengthen your muscles (resistance exercise), such as Pilates or lifting weights, as part of your weekly exercise routine. Try to do these types of exercises for 30 minutes at least 3 days a week. Do not use any products that contain nicotine  or tobacco. These products include cigarettes, chewing tobacco, and vaping devices, such as e-cigarettes. If you need help quitting, ask your health care provider. Monitor your blood pressure at home as told by your health care provider. Keep all follow-up visits. This is important. Medicines Take over-the-counter and prescription medicines only as told by your health care provider. Follow directions carefully. Blood pressure medicines must be taken as prescribed. Do not skip doses of blood pressure medicine. Doing this puts you at risk for problems and can make the medicine less effective. Ask your health care provider about side effects or reactions to medicines that you should watch for. Contact a health care provider if you: Think you are having a reaction to a medicine you are taking. Have headaches that keep coming back (recurring). Feel dizzy. Have swelling in your ankles. Have trouble with your vision. Get help right away if  you: Develop a severe headache or confusion. Have unusual weakness or numbness. Feel faint. Have severe pain  in your chest or abdomen. Vomit repeatedly. Have trouble breathing. These symptoms may be an emergency. Get help right away. Call 911. Do not wait to see if the symptoms will go away. Do not drive yourself to the hospital. Summary Hypertension is when the force of blood pumping through your arteries is too strong. If this condition is not controlled, it may put you at risk for serious complications. Your personal target blood pressure may vary depending on your medical conditions, your age, and other factors. For most people, a normal blood pressure is less than 120/80. Hypertension is treated with lifestyle changes, medicines, or a combination of both. Lifestyle changes include losing weight, eating a healthy, low-sodium diet, exercising more, and limiting alcohol. This information is not intended to replace advice given to you by your health care provider. Make sure you discuss any questions you have with your health care provider. Document Revised: 07/11/2021 Document Reviewed: 07/11/2021 Elsevier Patient Education  2024 [employer]       Emil Schaumann, MD [facility]

## 2024-03-23 NOTE — Telephone Encounter (Signed)
 Call pharmacy and ask about alternatives in the form of inhalers.  Patient can also go to a different pharmacy.

## 2024-04-15 ENCOUNTER — Other Ambulatory Visit: Payer: Self-pay | Admitting: Urology

## 2024-04-15 DIAGNOSIS — R972 Elevated prostate specific antigen [PSA]: Secondary | ICD-10-CM

## 2024-04-15 DIAGNOSIS — N402 Nodular prostate without lower urinary tract symptoms: Secondary | ICD-10-CM

## 2024-04-16 ENCOUNTER — Encounter: Payer: Self-pay | Admitting: Urology

## 2024-04-17 NOTE — Telephone Encounter (Signed)
 Call patient and inform him about his options to see what he prefers.  Thanks.

## 2024-04-20 NOTE — Telephone Encounter (Signed)
 Patient to call back after speaking with pharmacy about pricing

## 2024-05-11 ENCOUNTER — Ambulatory Visit (INDEPENDENT_AMBULATORY_CARE_PROVIDER_SITE_OTHER)

## 2024-05-11 VITALS — Ht 71.0 in | Wt 152.0 lb

## 2024-05-11 DIAGNOSIS — Z0000 Encounter for general adult medical examination without abnormal findings: Secondary | ICD-10-CM | POA: Diagnosis not present

## 2024-05-11 NOTE — Progress Notes (Signed)
 Subjective:   Justin Hu. is a 68 y.o. who presents for a Medicare Wellness preventive visit.  As a reminder, Annual Wellness Visits don't include a physical exam, and some assessments may be limited, especially if this visit is performed virtually. We may recommend an in-person follow-up visit with your provider if needed.  Visit Complete: Virtual I connected with  Justin Odom. on 05/11/24 by a audio enabled telemedicine application and verified that I am speaking with the correct person using two identifiers.  Patient Location: Home  Provider Location: Home Office  I discussed the limitations of evaluation and management by telemedicine. The patient expressed understanding and agreed to proceed.  Vital Signs: Because this visit was a virtual/telehealth visit, some criteria may be missing or patient reported. Any vitals not documented were not able to be obtained and vitals that have been documented are patient reported.  VideoDeclined- This patient declined Librarian, academic. Therefore the visit was completed with audio only.  Persons Participating in Visit: Patient.  AWV Questionnaire: No: Patient Medicare AWV questionnaire was not completed prior to this visit.  Cardiac Risk Factors include: advanced age (>78men, >44 women);hypertension;dyslipidemia;male gender;smoking/ tobacco exposure     Objective:    Today's Vitals   05/11/24 1003  Weight: 152 lb (68.9 kg)  Height: 5' 11 (1.803 m)   Body mass index is 21.2 kg/m.     05/11/2024   10:12 AM 02/10/2021   10:12 AM 02/18/2017    4:06 PM 12/26/2015    7:43 AM 12/12/2015    1:10 PM 10/24/2015   11:20 AM 05/02/2015   10:55 AM  Advanced Directives  Does Patient Have a Medical Advance Directive? No No No  No  No  No  No   Would patient like information on creating a medical advance directive?  No - Patient declined    No - patient declined information  Yes - Educational materials given       Data saved with a previous flowsheet row definition    Current Medications (verified) Outpatient Encounter Medications as of 05/11/2024  Medication Sig   amLODipine  (NORVASC ) 5 MG tablet Take 1 tablet (5 mg total) by mouth daily.   aspirin  EC 81 MG tablet Take 81 mg by mouth daily.   atorvastatin  (LIPITOR) 40 MG tablet TAKE 1 TABLET BY MOUTH EVERY DAY   cholecalciferol (VITAMIN D3) 25 MCG (1000 UNIT) tablet Take 1,000 Units by mouth daily.   Coenzyme Q10 (CO Q-10 PO) Take 1 capsule by mouth daily.   metoprolol  succinate (TOPROL -XL) 50 MG 24 hr tablet TAKE 1 TABLET DAILY. MUST KEEP 10/09/2022 APPT FOR ADDITIONAL REFILLS   nicotine  (NICOTROL ) 10 MG inhaler Inhale 1 Cartridge (1 continuous puffing total) into the lungs as needed for smoking cessation.   tamsulosin  (FLOMAX ) 0.4 MG CAPS capsule Take 1 capsule (0.4 mg total) by mouth at bedtime.   No facility-administered encounter medications on file as of 05/11/2024.    Allergies (verified) Patient has no known allergies.   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   Dyslipidemia 05/02/2015   Elevated coronary artery calcium  score 07/04/2020   Essential hypertension 02/02/2019   H/O TIA (transient ischemic attack) and stroke 11/29/2014   Hyperlipidemia  Lower urinary tract symptoms 08/27/2022   Premature heartbeats 09/28/2019   Past Surgical History:  Procedure Laterality Date   NO PAST SURGERIES     Family History  Problem Relation Age of Onset   Cancer Mother    Pancreatic cancer Mother    Cancer Sister    Colon cancer Neg Hx    Social History   Socioeconomic History   Marital status: Married    Spouse name: Not on file   Number of children: Not on file   Years  of education: Not on file   Highest education level: Not on file  Occupational History   Not on file  Tobacco Use   Smoking status: Some Days    Current packs/day: 0.00    Types: Cigarettes    Last attempt to quit: 09/24/2019    Years since quitting: 4.6   Smokeless tobacco: Never   Tobacco comments:    3 cigarettes a day  Vaping Use   Vaping status: Never Used  Substance and Sexual Activity   Alcohol use: Yes    Alcohol/week: 0.0 standard drinks of alcohol    Comment: occ   Drug use: Yes    Types: Marijuana   Sexual activity: Not on file  Other Topics Concern   Not on file  Social History Narrative   Lives with wife and grandaughter   Social Drivers of Health   Financial Resource Strain: Low Risk  (05/11/2024)   Overall Financial Resource Strain (CARDIA)    Difficulty of Paying Living Expenses: Not very hard  Food Insecurity: No Food Insecurity (05/11/2024)   Hunger Vital Sign    Worried About Running Out of Food in the Last Year: Never true    Ran Out of Food in the Last Year: Never true  Transportation Needs: No Transportation Needs (05/11/2024)   PRAPARE - Administrator, Civil Service (Medical): No    Lack of Transportation (Non-Medical): No  Physical Activity: Inactive (05/11/2024)   Exercise Vital Sign    Days of Exercise per Week: 0 days    Minutes of Exercise per Session: 0 min  Stress: No Stress Concern Present (05/11/2024)   [employer] of Occupational Health - Occupational Stress Questionnaire    Feeling of Stress: Not at all  Social Connections: Socially Integrated (05/11/2024)   Social Connection and Isolation Panel    Frequency of Communication with Friends and Family: More than three times a week    Frequency of Social Gatherings with Friends and Family: More than three times a week    Attends Religious Services: More than 4 times per year    Active Member of [employer] or Organizations: Yes    Attends Engineer, structural: More than 4  times per year    Marital Status: Married    Tobacco Counseling Ready to quit: Not Answered Counseling given: Not Answered Tobacco comments: 3 cigarettes a day    Clinical Intake:  Pre-visit preparation completed: Yes  Pain : No/denies pain     BMI - recorded: 21.2 Nutritional Status: BMI of 19-24  Normal Nutritional Risks: None Diabetes: No  Lab Results  Component Value Date   HGBA1C 4.9 09/23/2023   HGBA1C 5.1 08/29/2022   HGBA1C 5.10 11/29/2014     How often do you need to have someone help you when you read instructions, pamphlets, or other written materials from your doctor or pharmacy?: 1 - Never  Interpreter Needed?: No  Information entered by :: Melayna Robarts,  RMA   Activities of Daily Living     05/11/2024   10:04 AM  In your present state of health, do you have any difficulty performing the following activities:  Hearing? 0  Vision? 0  Difficulty concentrating or making decisions? 0  Walking or climbing stairs? 0  Dressing or bathing? 0  Doing errands, shopping? 0  Preparing Food and eating ? N  Using the Toilet? N  In the past six months, have you accidently leaked urine? N  Do you have problems with loss of bowel control? N  Managing your Medications? N  Managing your Finances? N  Housekeeping or managing your Housekeeping? N    Patient Care Team: Purcell Emil Schanz, MD as PCP - General (Internal Medicine)  I have updated your Care Teams any recent Medical Services you may have received from other providers in the past year.     Assessment:   This is a routine wellness examination for Justin Odom.  Hearing/Vision screen Hearing Screening - Comments:: Denies hearing difficulties   Vision Screening - Comments:: Denies vision issues./Would like a recommendation    Goals Addressed   None    Depression Screen     05/11/2024   10:14 AM 03/23/2024    8:14 AM 09/23/2023    4:22 PM 08/27/2022    8:28 AM 02/26/2022    8:05 AM 08/28/2021     1:24 PM 02/22/2021    2:49 PM  PHQ 2/9 Scores  PHQ - 2 Score 0 0 0 0 0 0 0  PHQ- 9 Score 0   0       Fall Risk     05/11/2024   10:12 AM 03/23/2024    8:14 AM 09/23/2023    4:22 PM 08/27/2022    8:29 AM 02/26/2022    8:05 AM  Fall Risk   Falls in the past year? 0 0 0 0 0  Number falls in past yr: 0 0 0    Injury with Fall? 0 0 0 0   Risk for fall due to :  No Fall Risks  No Fall Risks   Follow up Falls evaluation completed;Falls prevention discussed Falls evaluation completed  Falls evaluation completed       Data saved with a previous flowsheet row definition    MEDICARE RISK AT HOME:  Medicare Risk at Home Any stairs in or around the home?: No If so, are there any without handrails?: No Home free of loose throw rugs in walkways, pet beds, electrical cords, etc?: Yes Adequate lighting in your home to reduce risk of falls?: Yes Life alert?: No Use of a cane, walker or w/c?: No Grab bars in the bathroom?: No Shower chair or bench in shower?: No Elevated toilet seat or a handicapped toilet?: Yes  TIMED UP AND GO:  Was the test performed?  No  Cognitive Function: Declined/Normal: No cognitive concerns noted by patient or family. Patient alert, oriented, able to answer questions appropriately and recall recent events. No signs of memory loss or confusion.        Immunizations Immunization History  Administered Date(s) Administered   Influenza,inj,Quad PF,6+ Mos 09/03/2018, 08/20/2019   Influenza-Unspecified 09/03/2018, 08/14/2021   PFIZER(Purple Top)SARS-COV-2 Vaccination 12/08/2019, 12/29/2019    Screening Tests Health Maintenance  Topic Date Due   DTaP/Tdap/Td (1 - Tdap) Never done   Pneumococcal Vaccine: 50+ Years (1 of 2 - PCV) Never done   Zoster Vaccines- Shingrix (1 of 2) Never done   INFLUENZA VACCINE  04/17/2024   COVID-19 Vaccine (3 - 2024-25 season) 09/03/2024 (Originally 05/19/2023)   Medicare Annual Wellness (AWV)  05/11/2025   Colonoscopy  12/25/2025    Hepatitis C Screening  Completed   HPV VACCINES  Aged Out   Meningococcal B Vaccine  Aged Out    Health Maintenance  Health Maintenance Due  Topic Date Due   DTaP/Tdap/Td (1 - Tdap) Never done   Pneumococcal Vaccine: 50+ Years (1 of 2 - PCV) Never done   Zoster Vaccines- Shingrix (1 of 2) Never done   INFLUENZA VACCINE  04/17/2024   Health Maintenance Items Addressed: See Nurse Notes at the end of this note  Additional Screening:  Vision Screening: Recommended annual ophthalmology exams for early detection of glaucoma and other disorders of the eye. Would you like a referral to an eye doctor? Yes    Dental Screening: Recommended annual dental exams for proper oral hygiene  Community Resource Referral / Chronic Care Management: CRR required this visit?  No   CCM required this visit?  No   Plan:    I have personally reviewed and noted the following in the patient's chart:   Medical and social history Use of alcohol, tobacco or illicit drugs  Current medications and supplements including opioid prescriptions. Patient is not currently taking opioid prescriptions. Functional ability and status Nutritional status Physical activity Advanced directives List of other physicians Hospitalizations, surgeries, and ER visits in previous 12 months Vitals Screenings to include cognitive, depression, and falls Referrals and appointments  In addition, I have reviewed and discussed with patient certain preventive protocols, quality metrics, and best practice recommendations. A written personalized care plan for preventive services as well as general preventive health recommendations were provided to patient.   Justin Odom Justin Odom, CMA   05/11/2024   After Visit Summary: (Mail) Due to this being a telephonic visit, the after visit summary with patients personalized plan was offered to patient via mail   Notes: Patient is due for a Tdap and pneumonia vaccines.  He is due for a Shingles  vaccine, however patient stated that he has never had the chicken pox before.  Patient is also wanting a recommendation for an eye doctor.  He had no other concerns to address today.

## 2024-05-11 NOTE — Patient Instructions (Addendum)
 Justin Odom , Thank you for taking time out of your busy schedule to complete your Annual Wellness Visit with me. I enjoyed our conversation and look forward to speaking with you again next year. I, as well as your care team,  appreciate your ongoing commitment to your health goals. Please review the following plan we discussed and let me know if I can assist you in the future. Your Game plan/ To Do List     Follow up Visits: We will see or speak with you next year for your Next Medicare AWV with our clinical staff Have you seen your provider in the last 6 months (3 months if uncontrolled diabetes)? Yes  Clinician Recommendations:  Aim for 30 minutes of exercise or brisk walking, 6-8 glasses of water, and 5 servings of fruits and vegetables each day. You are due for a Tetanus vaccine and pneumonia vaccines.  You can get theses done at your Pharmacy.  Remember to inquire about an eye doctor to your provider during your next office visit.        This is a list of the screenings recommended for you:  Health Maintenance  Topic Date Due   DTaP/Tdap/Td vaccine (1 - Tdap) Never done   Pneumococcal Vaccine for age over 67 (1 of 2 - PCV) Never done   Zoster (Shingles) Vaccine (1 of 2) Never done   Flu Shot  04/17/2024   COVID-19 Vaccine (3 - 2024-25 season) 09/03/2024*   Medicare Annual Wellness Visit  05/11/2025   Colon Cancer Screening  12/25/2025   Hepatitis C Screening  Completed   HPV Vaccine  Aged Out   Meningitis B Vaccine  Aged Out  *Topic was postponed. The date shown is not the original due date.    Advanced directives: (Declined) Advance directive discussed with you today. Even though you declined this today, please call our office should you change your mind, and we can give you the proper paperwork for you to fill out. Advance Care Planning is important because it:  [x]  Makes sure you receive the medical care that is consistent with your values, goals, and preferences  [x]  It  provides guidance to your family and loved ones and reduces their decisional burden about whether or not they are making the right decisions based on your wishes.  Follow the link provided in your after visit summary or read over the paperwork we have mailed to you to help you started getting your Advance Directives in place. If you need assistance in completing these, please reach out to us  so that we can help you!  See attachments for Preventive Care and Fall Prevention Tips.

## 2024-05-29 ENCOUNTER — Other Ambulatory Visit: Payer: Self-pay | Admitting: Emergency Medicine

## 2024-05-29 DIAGNOSIS — E785 Hyperlipidemia, unspecified: Secondary | ICD-10-CM

## 2024-06-09 ENCOUNTER — Encounter: Payer: Self-pay | Admitting: *Deleted

## 2024-06-09 NOTE — Progress Notes (Signed)
 Raeford Veldman Jr.                                          MRN: [id number]   06/09/2024   The [facility] Quality Team Specialist reviewed this patient medical record for the purposes of chart review for care gap closure. The following were reviewed: abstraction for care gap closure-controlling blood pressure.    [facility] Quality Team

## 2024-09-23 ENCOUNTER — Encounter: Payer: Self-pay | Admitting: Emergency Medicine

## 2024-09-23 ENCOUNTER — Ambulatory Visit: Payer: Self-pay | Admitting: Emergency Medicine

## 2024-09-23 ENCOUNTER — Ambulatory Visit: Admitting: Emergency Medicine

## 2024-09-23 VITALS — BP 112/66 | HR 61 | Temp 98.1°F | Ht 71.0 in | Wt 148.4 lb

## 2024-09-23 DIAGNOSIS — Z8673 Personal history of transient ischemic attack (TIA), and cerebral infarction without residual deficits: Secondary | ICD-10-CM | POA: Diagnosis not present

## 2024-09-23 DIAGNOSIS — E785 Hyperlipidemia, unspecified: Secondary | ICD-10-CM

## 2024-09-23 DIAGNOSIS — R972 Elevated prostate specific antigen [PSA]: Secondary | ICD-10-CM | POA: Diagnosis not present

## 2024-09-23 DIAGNOSIS — F17200 Nicotine dependence, unspecified, uncomplicated: Secondary | ICD-10-CM | POA: Diagnosis not present

## 2024-09-23 DIAGNOSIS — I10 Essential (primary) hypertension: Secondary | ICD-10-CM

## 2024-09-23 LAB — LIPID PANEL
Cholesterol: 143 mg/dL (ref 28–200)
HDL: 33.4 mg/dL — ABNORMAL LOW
LDL Cholesterol: 88 mg/dL (ref 10–99)
NonHDL: 109.74
Total CHOL/HDL Ratio: 4
Triglycerides: 111 mg/dL (ref 10.0–149.0)
VLDL: 22.2 mg/dL (ref 0.0–40.0)

## 2024-09-23 LAB — COMPREHENSIVE METABOLIC PANEL WITH GFR
ALT: 8 U/L (ref 3–53)
AST: 13 U/L (ref 5–37)
Albumin: 4 g/dL (ref 3.5–5.2)
Alkaline Phosphatase: 70 U/L (ref 39–117)
BUN: 17 mg/dL (ref 6–23)
CO2: 31 meq/L (ref 19–32)
Calcium: 9.6 mg/dL (ref 8.4–10.5)
Chloride: 102 meq/L (ref 96–112)
Creatinine, Ser: 1.06 mg/dL (ref 0.40–1.50)
GFR: 72.79 mL/min
Glucose, Bld: 99 mg/dL (ref 70–99)
Potassium: 4.7 meq/L (ref 3.5–5.1)
Sodium: 138 meq/L (ref 135–145)
Total Bilirubin: 0.3 mg/dL (ref 0.2–1.2)
Total Protein: 7.2 g/dL (ref 6.0–8.3)

## 2024-09-23 LAB — CBC WITH DIFFERENTIAL/PLATELET
Basophils Absolute: 0.1 K/uL (ref 0.0–0.1)
Basophils Relative: 1 % (ref 0.0–3.0)
Eosinophils Absolute: 0.1 K/uL (ref 0.0–0.7)
Eosinophils Relative: 1.3 % (ref 0.0–5.0)
HCT: 34.6 % — ABNORMAL LOW (ref 39.0–52.0)
Hemoglobin: 11.3 g/dL — ABNORMAL LOW (ref 13.0–17.0)
Lymphocytes Relative: 35.7 % (ref 12.0–46.0)
Lymphs Abs: 2.3 K/uL (ref 0.7–4.0)
MCHC: 32.7 g/dL (ref 30.0–36.0)
MCV: 93.6 fl (ref 78.0–100.0)
Monocytes Absolute: 0.5 K/uL (ref 0.1–1.0)
Monocytes Relative: 8.2 % (ref 3.0–12.0)
Neutro Abs: 3.4 K/uL (ref 1.4–7.7)
Neutrophils Relative %: 53.8 % (ref 43.0–77.0)
Platelets: 409 K/uL — ABNORMAL HIGH (ref 150.0–400.0)
RBC: 3.7 Mil/uL — ABNORMAL LOW (ref 4.22–5.81)
RDW: 14.6 % (ref 11.5–15.5)
WBC: 6.3 K/uL (ref 4.0–10.5)

## 2024-09-23 LAB — PSA: PSA: 12.92 ng/mL — ABNORMAL HIGH (ref 0.10–4.00)

## 2024-09-23 LAB — HEMOGLOBIN A1C: Hgb A1c MFr Bld: 4.9 % (ref 4.6–6.5)

## 2024-09-23 NOTE — Assessment & Plan Note (Signed)
 Still smoking about 6 cigarettes a day. Cardiovascular and cancer risks associated with smoking discussed Smoking cessation advice given

## 2024-09-23 NOTE — Patient Instructions (Signed)
 Health Maintenance After Age 68 After age 27, you are at a higher risk for certain long-term diseases and infections as well as injuries from falls. Falls are a major cause of broken bones and head injuries in people who are older than age 73. Getting regular preventive care can help to keep you healthy and well. Preventive care includes getting regular testing and making lifestyle changes as recommended by your health care provider. Talk with your health care provider about: Which screenings and tests you should have. A screening is a test that checks for a disease when you have no symptoms. A diet and exercise plan that is right for you. What should I know about screenings and tests to prevent falls? Screening and testing are the best ways to find a health problem early. Early diagnosis and treatment give you the best chance of managing medical conditions that are common after age 90. Certain conditions and lifestyle choices may make you more likely to have a fall. Your health care provider may recommend: Regular vision checks. Poor vision and conditions such as cataracts can make you more likely to have a fall. If you wear glasses, make sure to get your prescription updated if your vision changes. Medicine review. Work with your health care provider to regularly review all of the medicines you are taking, including over-the-counter medicines. Ask your health care provider about any side effects that may make you more likely to have a fall. Tell your health care provider if any medicines that you take make you feel dizzy or sleepy. Strength and balance checks. Your health care provider may recommend certain tests to check your strength and balance while standing, walking, or changing positions. Foot health exam. Foot pain and numbness, as well as not wearing proper footwear, can make you more likely to have a fall. Screenings, including: Osteoporosis screening. Osteoporosis is a condition that causes  the bones to get weaker and break more easily. Blood pressure screening. Blood pressure changes and medicines to control blood pressure can make you feel dizzy. Depression screening. You may be more likely to have a fall if you have a fear of falling, feel depressed, or feel unable to do activities that you used to do. Alcohol  use screening. Using too much alcohol  can affect your balance and may make you more likely to have a fall. Follow these instructions at home: Lifestyle Do not drink alcohol  if: Your health care provider tells you not to drink. If you drink alcohol : Limit how much you have to: 0-1 drink a day for women. 0-2 drinks a day for men. Know how much alcohol  is in your drink. In the [place] one drink equals one 12 oz bottle of beer (355 mL), one 5 oz glass of wine (148 mL), or one 1 oz glass of hard liquor (44 mL). Do not use any products that contain nicotine or tobacco. These products include cigarettes, chewing tobacco, and vaping devices, such as e-cigarettes. If you need help quitting, ask your health care provider. Activity  Follow a regular exercise program to stay fit. This will help you maintain your balance. Ask your health care provider what types of exercise are appropriate for you. If you need a cane or walker, use it as recommended by your health care provider. Wear supportive shoes that have nonskid soles. Safety  Remove any tripping hazards, such as rugs, cords, and clutter. Install safety equipment such as grab bars in bathrooms and safety rails on stairs. Keep rooms and walkways  well-lit. General instructions Talk with your health care provider about your risks for falling. Tell your health care provider if: You fall. Be sure to tell your health care provider about all falls, even ones that seem minor. You feel dizzy, tiredness (fatigue), or off-balance. Take over-the-counter and prescription medicines only as told by your health care provider. These include  supplements. Eat a healthy diet and maintain a healthy weight. A healthy diet includes low-fat dairy products, low-fat (lean) meats, and fiber from whole grains, beans, and lots of fruits and vegetables. Stay current with your vaccines. Schedule regular health, dental, and eye exams. Summary Having a healthy lifestyle and getting preventive care can help to protect your health and wellness after age 15. Screening and testing are the best way to find a health problem early and help you avoid having a fall. Early diagnosis and treatment give you the best chance for managing medical conditions that are more common for people who are older than age 42. Falls are a major cause of broken bones and head injuries in people who are older than age 64. Take precautions to prevent a fall at home. Work with your health care provider to learn what changes you can make to improve your health and wellness and to prevent falls. This information is not intended to replace advice given to you by your health care provider. Make sure you discuss any questions you have with your health care provider. Document Revised: 01/23/2021 Document Reviewed: 01/23/2021 Elsevier Patient Education  2024 [employer].

## 2024-09-23 NOTE — Assessment & Plan Note (Signed)
Stable.  Diet and nutrition discussed. Continue atorvastatin 40 mg daily. 

## 2024-09-23 NOTE — Assessment & Plan Note (Signed)
 BP Readings from Last 3 Encounters:  09/23/24 112/66  03/23/24 138/76  12/03/23 (!) 147/81   Well-controlled hypertension Cardiovascular risks associated with hypertension discussed Dietary approaches to stop hypertension discussed Benefits of exercise discussed Recommend to continue metoprolol  succinate 50 mg daily and amlodipine  5 mg daily Blood work today Will follow-up in 6 months

## 2024-09-23 NOTE — Assessment & Plan Note (Signed)
 Secondary prevention discussed.  Continue baby aspirin  daily. Continue atorvastatin  40 mg daily.  Encouraged to stop smoking. Dangers of uncontrolled hypertension discussed Recommend to continue metoprolol  succinate 50 mg daily and amlodipine  5 mg daily

## 2024-09-23 NOTE — Progress Notes (Signed)
 Justin Odom. 69 y.o.   Chief Complaint  Patient presents with   Hypertension    6 month follow up. No major concerns.     HISTORY OF PRESENT ILLNESS: This is a 69 y.o. male here for 21-month follow-up of chronic medical conditions including hypertension Overall doing well.  Still smoking. No other complaints or medical concerns today  Hypertension Pertinent negatives include no chest pain, headaches, palpitations or shortness of breath.     Prior to Admission medications  Medication Sig Start Date End Date Taking? Authorizing Provider  amLODipine  (NORVASC ) 5 MG tablet Take 1 tablet (5 mg total) by mouth daily. 09/23/23  Yes SagardiaEmil Schanz, MD  aspirin  EC 81 MG tablet Take 81 mg by mouth daily.   Yes [provider]  atorvastatin  (LIPITOR) 40 MG tablet TAKE 1 TABLET BY MOUTH EVERY DAY 05/29/24  Yes Katelyn Kohlmeyer, Emil Schanz, MD  cholecalciferol (VITAMIN D3) 25 MCG (1000 UNIT) tablet Take 1,000 Units by mouth daily.   Yes [provider]  Coenzyme Q10 (CO Q-10 PO) Take 1 capsule by mouth daily.   Yes [provider]  metoprolol  succinate (TOPROL -XL) 50 MG 24 hr tablet TAKE 1 TABLET DAILY. MUST KEEP 10/09/2022 APPT FOR ADDITIONAL REFILLS 03/03/24  Yes Purcell Emil Schanz, MD  Multiple Vitamin (MULTIVITAMIN) tablet Take 1 tablet by mouth daily.   Yes [provider]  nicotine  (NICOTROL ) 10 MG inhaler Inhale 1 Cartridge (1 continuous puffing total) into the lungs as needed for smoking cessation. Patient not taking: Reported on 09/23/2024 03/23/24   [facility]cell Emil Schanz, MD  tamsulosin  (FLOMAX ) 0.4 MG CAPS capsule Take 1 capsule (0.4 mg total) by mouth at bedtime. 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   Dyslipidemia 05/02/2015   Elevated coronary artery calcium  score 07/04/2020   Essential hypertension 02/02/2019   H/O TIA (transient ischemic attack) and stroke 11/29/2014   Hyperlipidemia    Lower urinary tract symptoms 08/27/2022   Premature heartbeats 09/28/2019    Past Surgical History:  Procedure Laterality Date   NO PAST SURGERIES      Social History   Socioeconomic History   Marital status: Married    Spouse name: Not on file   Number of children: Not on file   Years of education: Not on file   Highest education level: Not on file  Occupational History   Not on file  Tobacco Use   Smoking status: Some Days    Current packs/day: 0.00    Average packs/day: 0.3 packs/day    Types: Cigarettes    Last attempt to quit: 09/24/2019    Years since quitting: 5.0   Smokeless tobacco: Never   Tobacco comments:    3 cigarettes  a day  Vaping Use   Vaping status: Never Used  Substance and Sexual Activity   Alcohol use: Yes    Alcohol/week: 0.0 standard drinks of alcohol    Comment: occ   Drug use: Yes    Types: Marijuana   Sexual activity: Not on file  Other Topics Concern   Not on file  Social History Narrative   Lives with wife and grandaughter   Social Drivers of Health   Tobacco Use: High Risk (09/23/2024)   Patient History    Smoking Tobacco Use: Some Days    Smokeless Tobacco Use: Never    Passive Exposure:  Not on file  Financial Resource Strain: Low Risk (05/11/2024)   Overall Financial Resource Strain (CARDIA)    Difficulty of Paying Living Expenses: Not very hard  Food Insecurity: No Food Insecurity (05/11/2024)   Epic    Worried About Radiation Protection Practitioner of Food in the Last Year: Never true    Ran Out of Food in the Last Year: Never true  Transportation Needs: No Transportation Needs (05/11/2024)   Epic    Lack of Transportation (Medical): No    Lack of Transportation (Non-Medical): No  Physical Activity: Inactive (05/11/2024)   Exercise Vital Sign    Days of Exercise per Week: 0 days    Minutes of Exercise per Session: 0 min  Stress: No Stress Concern Present (05/11/2024)   [employer] of Occupational Health - Occupational Stress Questionnaire    Feeling of Stress: Not at all  Social Connections: Socially Integrated (05/11/2024)   Social Connection and Isolation Panel    Frequency of Communication with Friends and Family: More than three times a week    Frequency of Social Gatherings with Friends and Family: More than three times a week    Attends Religious Services: More than 4 times per year    Active Member of Clubs or Organizations: Yes    Attends Banker Meetings: More than 4 times per year    Marital Status: Married  Catering Manager Violence: Not At Risk (05/11/2024)   Epic    Fear of Current or Ex-Partner: No    Emotionally Abused: No    Physically Abused: No    Sexually Abused: No  Depression (PHQ2-9): Low Risk (05/11/2024)   Depression (PHQ2-9)    PHQ-2 Score: 0  Alcohol Screen: Low Risk (05/11/2024)   Alcohol Screen    Last Alcohol Screening Score (AUDIT): 0  Housing: Unknown (05/11/2024)   Epic    Unable to Pay for Housing in the Last Year: No    Number of Times Moved in the Last Year: Not on file    Homeless in the Last Year: No  Utilities: Not At Risk (05/11/2024)   Epic    Threatened with loss of utilities: No  Health Literacy: Adequate Health Literacy  (05/11/2024)   B1300 Health Literacy    Frequency of need for help with medical instructions: Never    Family History  Problem Relation Age of Onset   Cancer Mother    Pancreatic cancer Mother    Cancer Sister    Colon cancer Neg Hx      Review of Systems  Constitutional: Negative.  Negative for chills and fever.  HENT: Negative.  Negative for congestion and sore throat.   Respiratory: Negative.  Negative for cough and shortness of breath.   Cardiovascular: Negative.  Negative for chest pain and palpitations.  Gastrointestinal:  Negative for abdominal pain, diarrhea, nausea and vomiting.  Genitourinary: Negative.  Negative for dysuria and hematuria.  Skin: Negative.  Negative for rash.  Neurological: Negative.  Negative for dizziness and headaches.  All other systems reviewed and are negative.   Vitals:   09/23/24 0808  BP: 112/66  Pulse: 61  Temp: 98.1 F (36.7 C)  SpO2: 96%    Physical Exam Vitals reviewed.  Constitutional:      Appearance: Normal appearance.  HENT:     Head: Normocephalic.     Mouth/Throat:     Mouth: Mucous membranes are moist.     Pharynx: Oropharynx is clear.  Eyes:     Extraocular Movements: Extraocular movements intact.     Conjunctiva/sclera: Conjunctivae normal.     Pupils: Pupils are equal, round, and reactive to light.  Cardiovascular:     Rate and Rhythm: Normal rate and regular rhythm.     Pulses: Normal pulses.     Heart sounds: Normal heart sounds.  Pulmonary:     Effort: Pulmonary effort is normal.     Breath sounds: Normal breath sounds.  Abdominal:     Palpations: Abdomen is soft.     Tenderness: There is no abdominal tenderness.  Musculoskeletal:     Cervical back: No tenderness.  Lymphadenopathy:     Cervical: No cervical adenopathy.  Skin:    General: Skin is warm and dry.  Neurological:     General: No focal deficit present.     Mental Status: He is alert and oriented to person, place, and time.  Psychiatric:         Mood and Affect: Mood normal.        Behavior: Behavior normal.      ASSESSMENT & PLAN: A total of 42 minutes was spent with the patient and counseling/coordination of care regarding preparing for this visit, review of most recent office visit notes, review of multiple chronic medical conditions and their management, cardiovascular risks associated with hypertension, review of all medications, review of most recent bloodwork results, review of health maintenance items, education on nutrition, prognosis, documentation, and need for follow up.   Problem List Items Addressed This Visit       Cardiovascular and Mediastinum   Essential hypertension - Primary   BP Readings from Last 3 Encounters:  09/23/24 112/66  03/23/24 138/76  12/03/23 (!) 147/81   Well-controlled hypertension Cardiovascular risks associated with hypertension discussed Dietary approaches to stop hypertension discussed Benefits of exercise discussed Recommend to continue metoprolol  succinate 50 mg daily and amlodipine  5 mg daily Blood work today Will follow-up in 6 months      Relevant Orders   CBC with Differential/Platelet   Comprehensive metabolic panel with GFR     Other   H/O TIA (transient ischemic attack) and stroke   Secondary prevention discussed.  Continue baby aspirin  daily. Continue atorvastatin  40 mg daily.  Encouraged to stop smoking. Dangers of uncontrolled hypertension discussed Recommend to continue metoprolol  succinate 50 mg daily and amlodipine  5 mg daily      Dyslipidemia   Stable.  Diet and nutrition discussed. Continue atorvastatin  40 mg daily.      Relevant Orders   Comprehensive metabolic panel with GFR   Hemoglobin A1c   Lipid panel   Current smoker   Still smoking about 6 cigarettes a day. Cardiovascular and cancer risks associated with smoking discussed Smoking cessation advice given      Relevant Orders   CBC with Differential/Platelet   Other Visit Diagnoses  Elevated PSA       Relevant Orders   PSA      Patient Instructions  Health Maintenance After Age 31 After age 54, you are at a higher risk for certain long-term diseases and infections as well as injuries from falls. Falls are a major cause of broken bones and head injuries in people who are older than age 49. Getting regular preventive care can help to keep you healthy and well. Preventive care includes getting regular testing and making lifestyle changes as recommended by your health care provider. Talk with your health care provider about: Which screenings and tests you should have. A screening is a test that checks for a disease when you have no symptoms. A diet and exercise plan that is right for you. What should I know about screenings and tests to prevent falls? Screening and testing are the best ways to find a health problem early. Early diagnosis and treatment give you the best chance of managing medical conditions that are common after age 44. Certain conditions and lifestyle choices may make you more likely to have a fall. Your health care provider may recommend: Regular vision checks. Poor vision and conditions such as cataracts can make you more likely to have a fall. If you wear glasses, make sure to get your prescription updated if your vision changes. Medicine review. Work with your health care provider to regularly review all of the medicines you are taking, including over-the-counter medicines. Ask your health care provider about any side effects that may make you more likely to have a fall. Tell your health care provider if any medicines that you take make you feel dizzy or sleepy. Strength and balance checks. Your health care provider may recommend certain tests to check your strength and balance while standing, walking, or changing positions. Foot health exam. Foot pain and numbness, as well as not wearing proper footwear, can make you more likely to have a  fall. Screenings, including: Osteoporosis screening. Osteoporosis is a condition that causes the bones to get weaker and break more easily. Blood pressure screening. Blood pressure changes and medicines to control blood pressure can make you feel dizzy. Depression screening. You may be more likely to have a fall if you have a fear of falling, feel depressed, or feel unable to do activities that you used to do. Alcohol use screening. Using too much alcohol can affect your balance and may make you more likely to have a fall. Follow these instructions at home: Lifestyle Do not drink alcohol if: Your health care provider tells you not to drink. If you drink alcohol: Limit how much you have to: 0-1 drink a day for women. 0-2 drinks a day for men. Know how much alcohol is in your drink. In the [place] one drink equals one 12 oz bottle of beer (355 mL), one 5 oz glass of wine (148 mL), or one 1 oz glass of hard liquor (44 mL). Do not use any products that contain nicotine  or tobacco. These products include cigarettes, chewing tobacco, and vaping devices, such as e-cigarettes. If you need help quitting, ask your health care provider. Activity  Follow a regular exercise program to stay fit. This will help you maintain your balance. Ask your health care provider what types of exercise are appropriate for you. If you need a cane or walker, use it as recommended by your health care provider. Wear supportive shoes that have nonskid soles. Safety  Remove any tripping hazards, such as rugs,  cords, and clutter. Install safety equipment such as grab bars in bathrooms and safety rails on stairs. Keep rooms and walkways well-lit. General instructions Talk with your health care provider about your risks for falling. Tell your health care provider if: You fall. Be sure to tell your health care provider about all falls, even ones that seem minor. You feel dizzy, tiredness (fatigue), or off-balance. Take  over-the-counter and prescription medicines only as told by your health care provider. These include supplements. Eat a healthy diet and maintain a healthy weight. A healthy diet includes low-fat dairy products, low-fat (lean) meats, and fiber from whole grains, beans, and lots of fruits and vegetables. Stay current with your vaccines. Schedule regular health, dental, and eye exams. Summary Having a healthy lifestyle and getting preventive care can help to protect your health and wellness after age 65. Screening and testing are the best way to find a health problem early and help you avoid having a fall. Early diagnosis and treatment give you the best chance for managing medical conditions that are more common for people who are older than age 30. Falls are a major cause of broken bones and head injuries in people who are older than age 60. Take precautions to prevent a fall at home. Work with your health care provider to learn what changes you can make to improve your health and wellness and to prevent falls. This information is not intended to replace advice given to you by your health care provider. Make sure you discuss any questions you have with your health care provider. Document Revised: 01/23/2021 Document Reviewed: 01/23/2021 Elsevier Patient Education  2024 [employer]     Emil Schaumann, MD [facility]    [1] No Known Allergies

## 2024-09-26 ENCOUNTER — Other Ambulatory Visit: Payer: Self-pay | Admitting: Emergency Medicine

## 2024-09-26 DIAGNOSIS — I10 Essential (primary) hypertension: Secondary | ICD-10-CM

## 2025-05-12 ENCOUNTER — Ambulatory Visit
